# Patient Record
Sex: Female | Born: 1940 | Hispanic: Yes | Marital: Married | Smoking: Never smoker
Health system: Southern US, Community
[De-identification: ages and names within clinical notes are randomized; demographics above are authoritative.]

## PROBLEM LIST (undated history)

## (undated) DIAGNOSIS — F419 Anxiety disorder, unspecified: Secondary | ICD-10-CM

---

## 2010-12-23 ENCOUNTER — Emergency Department (HOSPITAL_COMMUNITY)
Admission: EM | Admit: 2010-12-23 | Discharge: 2010-12-23 | Disposition: A | Discharge status: Home / Self Care | Payer: Self-pay | Attending: Emergency Medicine | Admitting: Emergency Medicine

## 2010-12-23 ENCOUNTER — Other Ambulatory Visit: Payer: Self-pay

## 2010-12-23 ENCOUNTER — Emergency Department (HOSPITAL_COMMUNITY): Payer: Self-pay

## 2010-12-23 DIAGNOSIS — R071 Chest pain on breathing: Secondary | ICD-10-CM | POA: Insufficient documentation

## 2010-12-23 DIAGNOSIS — R0789 Other chest pain: Secondary | ICD-10-CM

## 2010-12-23 DIAGNOSIS — M546 Pain in thoracic spine: Secondary | ICD-10-CM | POA: Insufficient documentation

## 2010-12-23 HISTORY — DX: Anxiety disorder, unspecified: F41.9

## 2010-12-23 LAB — DIFFERENTIAL
Basophils Absolute: 0 10*3/uL (ref 0.0–0.1)
Eosinophils Absolute: 0.1 10*3/uL (ref 0.0–0.7)
Eosinophils Relative: 1 % (ref 0–5)
Neutrophils Relative %: 63 % (ref 43–77)

## 2010-12-23 LAB — D-DIMER, QUANTITATIVE: D-Dimer, Quant: 0.41 ug/mL-FEU (ref 0.00–0.48)

## 2010-12-23 LAB — CBC
MCH: 31.3 pg (ref 26.0–34.0)
MCV: 94.1 fL (ref 78.0–100.0)
Platelets: 183 10*3/uL (ref 150–400)
RDW: 12.9 % (ref 11.5–15.5)
WBC: 7.1 10*3/uL (ref 4.0–10.5)

## 2010-12-23 LAB — BASIC METABOLIC PANEL
Calcium: 8.4 mg/dL (ref 8.4–10.5)
GFR calc Af Amer: >90 mL/min (ref >90)
GFR calc non Af Amer: 89 mL/min — ABNORMAL LOW (ref >90)
Potassium: 3.6 mEq/L (ref 3.5–5.1)
Sodium: 142 mEq/L (ref 135–145)

## 2010-12-23 LAB — POCT I-STAT TROPONIN I: Troponin i, poc: 0 ng/mL (ref 0.00–0.08)

## 2010-12-23 MED ORDER — IBUPROFEN 800 MG PO TABS
400.0000 mg | ORAL_TABLET | Freq: Three times a day (TID) | ORAL | Status: AC | PRN
Start: 2010-12-23 — End: 2011-01-02

## 2010-12-23 NOTE — ED Notes (Signed)
Pt denies any pain at this time.

## 2010-12-23 NOTE — ED Notes (Signed)
Pt back from X-ray.  

## 2010-12-23 NOTE — ED Notes (Signed)
Dr Fredricka Bonine and family notified of 2nd neg troponing

## 2010-12-23 NOTE — ED Provider Notes (Signed)
6:59 PM The patient is 70 year old female who has a previous history of chest discomfort at is sharp radiating to the left shoulder and had this pain again today but reports that she no different than her prior pain. She is from [place] her family tells [place] that she has had this pain extensively evaluated by cardiologist in [place] and it has never been found to be due to anything serious or cardiac related. At this time I have been signed out the patient from Dr. Anitra Lauth for followup on a repeat troponin if negative would allow the patient to be discharged home with confidence that this is not a cardiac event. The repeat troponin has returned and is negative and not suggestive of any myocardial ischemia or infarction. The patient is in no distress and no current pain. I explained the findings to the patient and the family who state their understanding of and agreement with the plan of discharge home.  Felisa Bonier, MD 12/23/10 [phone]

## 2010-12-23 NOTE — ED Notes (Signed)
Patient denies pain and is resting comfortably.  

## 2010-12-23 NOTE — ED Notes (Signed)
Pt denies chest pain at this time.

## 2010-12-23 NOTE — ED Provider Notes (Signed)
History     CSN: [id number] Arrival date & time: 12/23/2010  2:06 PM   First MD Initiated Contact with Patient 12/23/10 1414      Chief Complaint  Patient presents with  . Chest Pain    Pt with sharp CP in left rads to the back, 8/10 now 5/10 after NTG,     (Consider location/radiation/quality/duration/timing/severity/associated sxs/prior treatment) HPI Comments: Patient recently arrived from [place] here to visit her daughter. They were at the mall walking around about an hour after she had eaten and she developed sharp left-sided chest pain that radiated to her upper back without associated symptoms. Pain was initially 6/10 and they called the EMS. EMS gave her aspirin and nitroglycerin which improved her pain to a 3/10. She states she gets this pain all the time and has had multiple evaluations in [place] for the same. Her last evaluation was one month ago and she was told that everything was normal. She denies any leg swelling or pain and denies any infectious symptoms.  Patient is a 70 y.o. female presenting with chest pain. The history is provided by the patient and a relative. The history is limited by a language barrier. A language interpreter was used.  Chest Pain The chest pain began less than 1 hour ago. Duration of episode(s) is 15 minutes. Chest pain occurs constantly. The chest pain is improving. Associated with: Nothing. At its most intense, the pain is at 6/10. The pain is currently at 3/10. The severity of the pain is moderate. The quality of the pain is described as sharp and stabbing. The pain radiates to the upper back. Exacerbated by: Nothing. Pertinent negatives for primary symptoms include no fever, no syncope, no shortness of breath, no cough, no abdominal pain, no nausea, no vomiting and no dizziness. Primary symptoms comment: Anxiety  Pertinent negatives for associated symptoms include no diaphoresis, no lower extremity edema and no weakness. She tried nitroglycerin and  aspirin for the symptoms. Risk factors include no known risk factors.  Pertinent negatives for past medical history include no diabetes, no hyperlipidemia and no hypertension.  Pertinent negatives for family medical history include: no CAD in family and no heart disease in family.  Procedure history is positive for echocardiogram, stress echo and exercise treadmill test.     Past Medical History  Diagnosis Date  . Anxiety     No past surgical history on file.  No family history on file.  History  Substance Use Topics  . Smoking status: Never Smoker   . Smokeless tobacco: Not on file  . Alcohol Use: No    OB History    Grav Para Term Preterm Abortions TAB SAB Ect Mult Living                  Review of Systems  Constitutional: Negative for fever and diaphoresis.  Respiratory: Negative for cough and shortness of breath.   Cardiovascular: Positive for chest pain. Negative for syncope.  Gastrointestinal: Negative for nausea, vomiting and abdominal pain.  Neurological: Negative for dizziness and weakness.  All other systems reviewed and are negative.    Allergies  Penicillins  Home Medications  No current outpatient prescriptions on file.  BP 129/60  Pulse 65  Temp(Src) 98.2 F (36.8 C) (Oral)  Resp 14  SpO2 94%  Physical Exam  Nursing note and vitals reviewed. Constitutional: She is oriented to person, place, and time. She appears well-developed and well-nourished. No distress.  HENT:  Head: Normocephalic and atraumatic.  Eyes: EOM are normal. Pupils are equal, round, and reactive to light.  Cardiovascular: Normal rate, regular rhythm, normal heart sounds and intact distal pulses.  Exam reveals no friction rub.   No murmur heard. Pulmonary/Chest: Effort normal and breath sounds normal. She has no wheezes. She has no rales.  Abdominal: Soft. Bowel sounds are normal. She exhibits no distension. There is no tenderness. There is no rebound and no guarding.    Musculoskeletal: Normal range of motion. She exhibits no tenderness.       No edema  Neurological: She is alert and oriented to person, place, and time. No cranial nerve deficit.  Skin: Skin is warm and dry. No rash noted.  Psychiatric: She has a normal mood and affect. Her behavior is normal.    ED Course  Procedures (including critical care time)  Results for orders placed during the hospital encounter of 12/23/10  D-DIMER, QUANTITATIVE      Component Value Range   D-Dimer, Quant 0.41  0.00 - 0.48 (ug/mL-FEU)  POCT I-STAT TROPONIN I      Component Value Range   Troponin i, poc 0.00  0.00 - 0.08 (ng/mL)   Comment 3            Dg Chest 2 View  12/23/2010  *RADIOLOGY REPORT*  Clinical Data: Chest pain.  CHEST - 2 VIEW  Comparison: None.  Findings: The heart size is normal.  Mild bibasilar airspace disease is present, worse on the left.  No other consolidation is evident.  The visualized soft tissues and bony thorax are unremarkable.  The  IMPRESSION:  1.  Mild bibasilar airspace disease, worse on the left.  While this may represent atelectasis, early infection is not excluded. 2.  No other significant airspace disease.  Original Report Authenticated By: Jamesetta Orleans. MATTERN, M.D.      Date: 12/23/2010  Rate: 65  Rhythm: normal sinus rhythm  QRS Axis: normal  Intervals: normal  ST/T Wave abnormalities: normal  Conduction Disutrbances:none  Narrative Interpretation:   Old EKG Reviewed: none available    No diagnosis found.    MDM   Patient presenting complaining of chest pain that started while she was at the mall. It is a sharp pain that started suddenly while she had been walking around. She denied any shortness of breath, diaphoresis, nausea, vomiting. She states the pain is very sharp and radiates into her left shoulder. Patient has had this pain for many years. She lives in [place] and recently flew in to visit family. She's been evaluated multiple times in [place] with  echocardiograms, stress tests, lab work and nothing has ever been found. Typically she says she takes an aspirin when she gets the pain and a nitroglycerin and it goes away. She has no history of hypertension, hyperlipidemia, and diabetes, or family history of heart disease. She has never smoked and she is well appearing here on exam. After aspirin and nitroglycerin by EMS she is feeling much better and the pain has almost completely resolved. EKG is within normal limits. Given history and prior testing lower concern for cardiac source of pain however will evaluate with CBC to rule out anemia, BMP for electrolyte disturbances, d-dimer as she is a low risk well's candidates, and chest x-ray.   3:44 PM D-dimer, troponin are within normal limits. Chest x-ray within normal limits. CBC and BMP are pending. Will send a 3 hour troponin And if normal will discharge home.   Gwyneth Sprout, MD 12/23/10 [phone]

## 2013-05-06 IMAGING — CR DG CHEST 2V
2 series · 2 of 2 positions shown · non-contrast
Comparison: None.

CLINICAL DATA: Chest pain.

CHEST - 2 VIEW

[w chest pa]
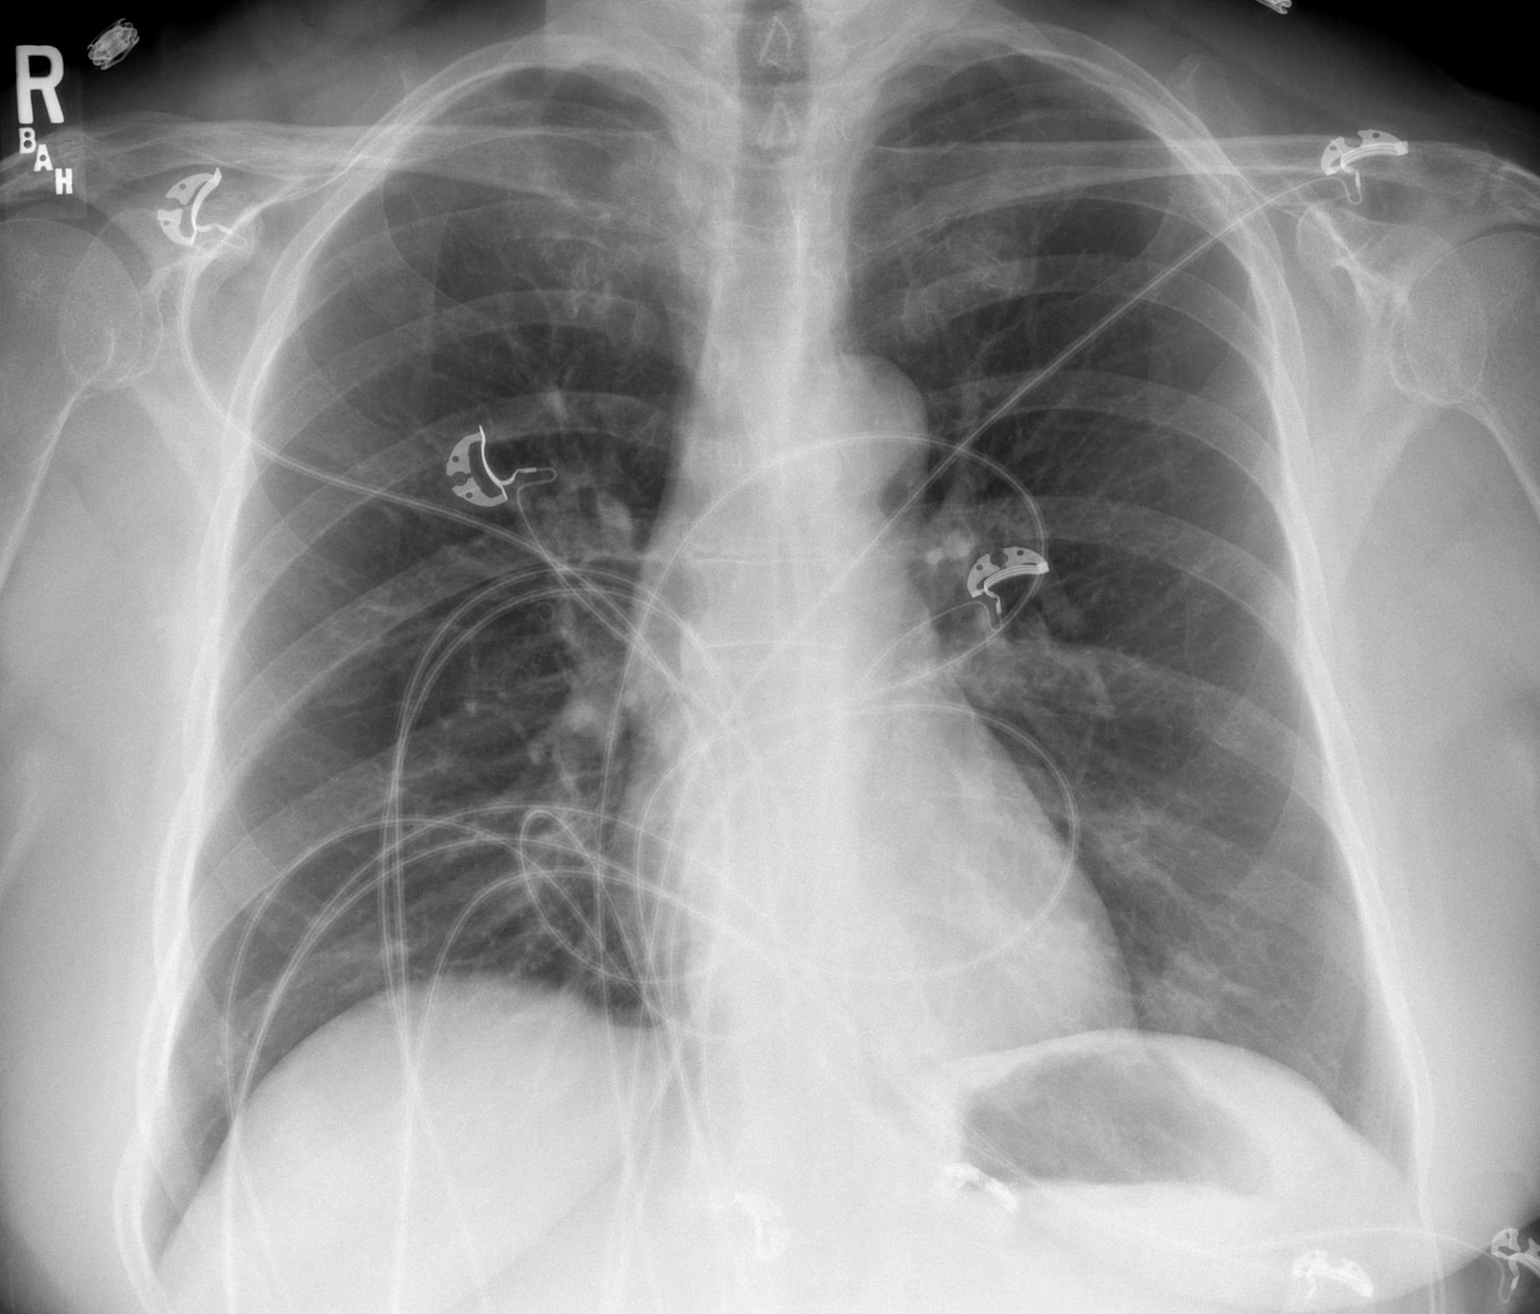

[w chest lat]
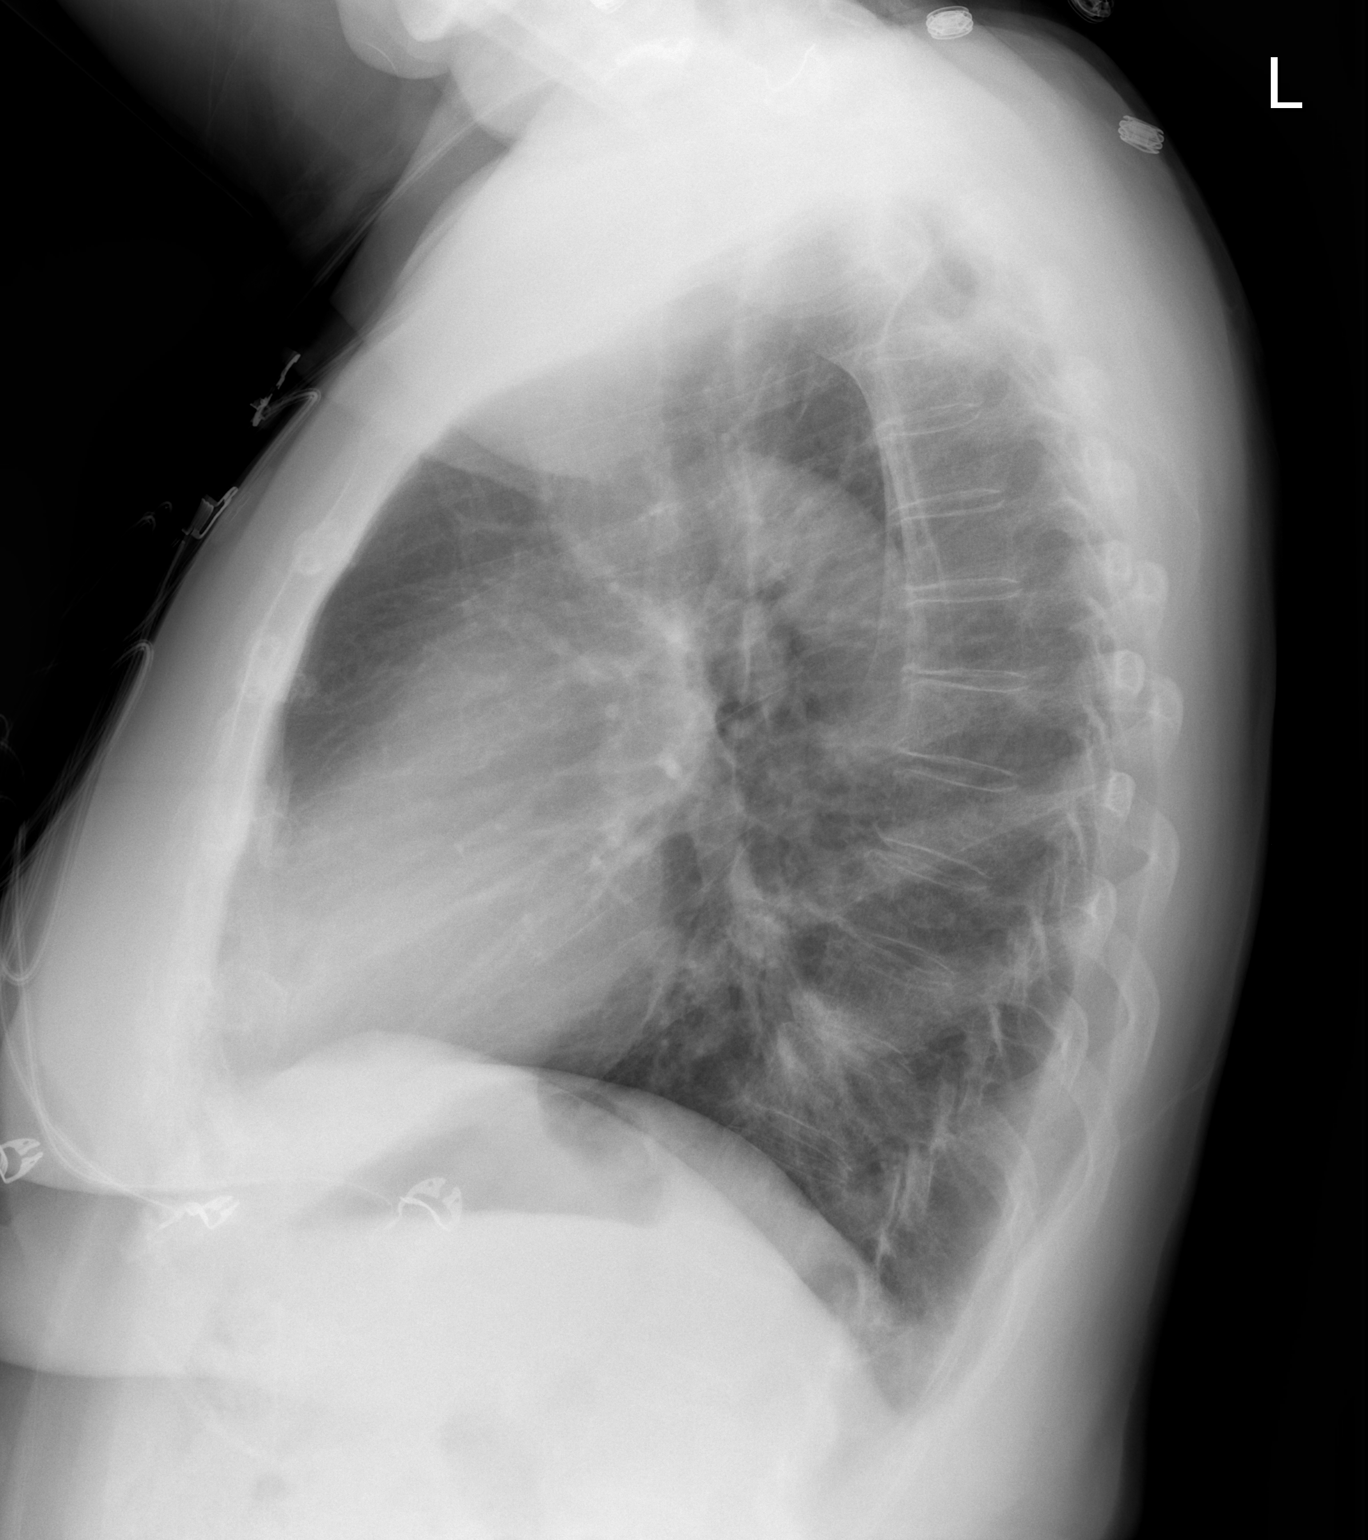

[2 of 2 positions shown; findings below may reference images not displayed]

FINDINGS: The heart size is normal.  Mild bibasilar airspace
disease is present, worse on the left.  No other consolidation is
evident.  The visualized soft tissues and bony thorax are
unremarkable.  The
IMPRESSION: 1.  Mild bibasilar airspace disease, worse on the left.  While this
may represent atelectasis, early infection is not excluded.
2.  No other significant airspace disease.

## 2019-04-25 ENCOUNTER — Encounter: Payer: Self-pay | Admitting: Neurology

## 2019-07-31 ENCOUNTER — Ambulatory Visit: Payer: Self-pay | Admitting: Neurology

## 2019-10-22 ENCOUNTER — Ambulatory Visit: Payer: Self-pay | Admitting: Internal Medicine

## 2020-02-20 ENCOUNTER — Ambulatory Visit (INDEPENDENT_AMBULATORY_CARE_PROVIDER_SITE_OTHER): Payer: Self-pay | Admitting: Family Medicine

## 2020-02-20 ENCOUNTER — Other Ambulatory Visit: Payer: Self-pay

## 2020-02-20 VITALS — BP 124/60 | HR 68 | Ht 60.0 in | Wt 154.2 lb

## 2020-02-20 DIAGNOSIS — F0390 Unspecified dementia without behavioral disturbance: Secondary | ICD-10-CM

## 2020-02-20 LAB — POCT URINALYSIS DIP (MANUAL ENTRY)
Bilirubin, UA: NEGATIVE
Blood, UA: NEGATIVE
Glucose, UA: NEGATIVE mg/dL
Leukocytes, UA: NEGATIVE
Nitrite, UA: NEGATIVE
Protein Ur, POC: NEGATIVE mg/dL
Spec Grav, UA: >=1.03 — AB (ref 1.010–1.025)
Urobilinogen, UA: 0.2 E.U./dL
pH, UA: 5.5 (ref 5.0–8.0)

## 2020-02-20 NOTE — Progress Notes (Signed)
    SUBJECTIVE:   CHIEF COMPLAINT / HPI:   Dorothy Holland is a 80 year old who is here to establish care. She is accompanied by her son in law. A Spanish interpreter was present for visit, but son interpreted the majority of the visit.  Son reports that her insurance recently changed which is why she switched from her previous provider. Will try to obtain records.   Son reports mom was diagnosed with dementia about 7 or 8 years ago. States she had significant personality changes and had a difficult time recognizing family members. States she has been seeing a specialist in [facility] and was put on medication several months ago and has greatly improved, now without behavioral changes, and is able to recognize her family. Denies hypertension, diabetes or any other medical condition. Does endorse patient falling and hurting her shoulder last year around July, and imaging was negative for fracture. Also endorses that patient has a foul odor to her urine. States that she does not drink enough water, and they have been working to increase that. Denies any other concerns.   Denies tobacco, alcohol, or recreational drug use.   PERTINENT  PMH / PSH:  Dementia           OBJECTIVE:   BP 124/60   Pulse 68   Ht 5' (1.524 m)   Wt 69.9 kg   SpO2 97%   BMI 30.12 kg/m    Physical exam General: well appearing, NAD Cardiovascular: RRR, no murmurs Lungs: CTAB. Normal WOB Abdomen: soft, non-distended, non-tender Skin: warm, dry. No edema  ASSESSMENT/PLAN:   No problem-specific Assessment & Plan notes found for this encounter.   Concern for urinary infection Malodorous urine according to son. Denies pain, urinary frequency or hesitancy but difficult to accurately assess given patient's dementia.  - UA negative for infection. Trace ketones and > 1.030 SG likely due to dehydration. - recommended increasing water intake   Dementia Patient taking Donepezil BID, Paroxetine 20mg  qd,  Memantine  10mg  BID - con't meds   Pre-diabetes Hgb A1c 6.3. No medications recommended  - con't healthy and walking  - con't Atorvastatin   Health maintenance  Patient has received COVID vaccines x3 ) as well as flu vaccine.  - CBC, CMP, lipid panel, TSH all wnl   F/u in the next 3 months since new patient, and will hopefully have records by then   , DO [facility]

## 2020-02-20 NOTE — Patient Instructions (Addendum)
It was great seeing you today! Today we established care at the clinic, and are checking your blood work  Please check-out at the front desk before leaving the clinic. I'd like to see you back in 1-3 months but if you need to be seen earlier than that for any new issues we're happy to fit you in, just give [place] a call!  Regarding lab work today:  Due to recent changes in healthcare laws, you may see the results of your imaging and laboratory studies on MyChart before your provider has had a chance to review them.  I understand that in some cases there may be results that are confusing or concerning to you. Not all laboratory results come back in the same time frame and you may be waiting for multiple results in order to interpret others.  Please give [place] 72 hours in order for your provider to thoroughly review all the results before contacting the office for clarification of your results. If everything is normal, you will get a letter in the mail or a message in My Chart. Please give [place] a call if you do not hear from [place] after 2 weeks.  Please bring all of your medications with you to each visit.    If you haven't already, sign up for My Chart to have easy access to your labs results, and communication with your primary care physician.  Feel free to call with any questions or concerns at any time, at [phone].   Take care,  Dr. Cora Collum [facility]

## 2020-02-21 LAB — LIPID PANEL
Chol/HDL Ratio: 2.1 ratio (ref 0.0–4.4)
Cholesterol, Total: 118 mg/dL (ref 100–199)
HDL: 57 mg/dL (ref >39)
LDL Chol Calc (NIH): 38 mg/dL (ref 0–99)
Triglycerides: 131 mg/dL (ref 0–149)
VLDL Cholesterol Cal: 23 mg/dL (ref 5–40)

## 2020-02-21 LAB — COMPREHENSIVE METABOLIC PANEL
ALT: 16 IU/L (ref 0–32)
AST: 22 IU/L (ref 0–40)
Albumin/Globulin Ratio: 1.6 (ref 1.2–2.2)
Albumin: 3.8 g/dL (ref 3.7–4.7)
Alkaline Phosphatase: 86 IU/L (ref 44–121)
BUN/Creatinine Ratio: 25 (ref 12–28)
BUN: 16 mg/dL (ref 8–27)
Bilirubin Total: 0.3 mg/dL (ref 0.0–1.2)
CO2: 26 mmol/L (ref 20–29)
Calcium: 8.9 mg/dL (ref 8.7–10.3)
Chloride: 103 mmol/L (ref 96–106)
Creatinine, Ser: 0.63 mg/dL (ref 0.57–1.00)
GFR calc Af Amer: 99 mL/min/{1.73_m2} (ref >59)
GFR calc non Af Amer: 86 mL/min/{1.73_m2} (ref >59)
Globulin, Total: 2.4 g/dL (ref 1.5–4.5)
Glucose: 98 mg/dL (ref 65–99)
Potassium: 3.9 mmol/L (ref 3.5–5.2)
Sodium: 142 mmol/L (ref 134–144)
Total Protein: 6.2 g/dL (ref 6.0–8.5)

## 2020-02-21 LAB — CBC
Hematocrit: 40 % (ref 34.0–46.6)
Hemoglobin: 13.2 g/dL (ref 11.1–15.9)
MCH: 30.8 pg (ref 26.6–33.0)
MCHC: 33 g/dL (ref 31.5–35.7)
MCV: 94 fL (ref 79–97)
Platelets: 216 10*3/uL (ref 150–450)
RBC: 4.28 x10E6/uL (ref 3.77–5.28)
RDW: 12.7 % (ref 11.7–15.4)
WBC: 6 10*3/uL (ref 3.4–10.8)

## 2020-02-21 LAB — HCV INTERPRETATION

## 2020-02-21 LAB — TSH: TSH: 2.36 u[IU]/mL (ref 0.450–4.500)

## 2020-02-21 LAB — HCV AB W REFLEX TO QUANT PCR: HCV Ab: <0.1 s/co ratio (ref 0.0–0.9)

## 2020-02-21 LAB — HEMOGLOBIN A1C
Est. average glucose Bld gHb Est-mCnc: 134 mg/dL
Hgb A1c MFr Bld: 6.3 % — ABNORMAL HIGH (ref 4.8–5.6)

## 2020-11-10 ENCOUNTER — Observation Stay (HOSPITAL_COMMUNITY)
Admission: EM | Admit: 2020-11-10 | Discharge: 2020-11-11 | Disposition: A | Discharge status: Home / Self Care | Payer: Medicare Other | Attending: Family Medicine | Admitting: Family Medicine

## 2020-11-10 ENCOUNTER — Emergency Department (HOSPITAL_COMMUNITY): Payer: Medicare Other

## 2020-11-10 DIAGNOSIS — F0390 Unspecified dementia without behavioral disturbance: Secondary | ICD-10-CM | POA: Insufficient documentation

## 2020-11-10 DIAGNOSIS — R001 Bradycardia, unspecified: Secondary | ICD-10-CM | POA: Diagnosis not present

## 2020-11-10 DIAGNOSIS — G309 Alzheimer's disease, unspecified: Secondary | ICD-10-CM | POA: Diagnosis not present

## 2020-11-10 DIAGNOSIS — R4182 Altered mental status, unspecified: Secondary | ICD-10-CM | POA: Diagnosis not present

## 2020-11-10 DIAGNOSIS — R7303 Prediabetes: Secondary | ICD-10-CM | POA: Insufficient documentation

## 2020-11-10 DIAGNOSIS — T426X4A Poisoning by other antiepileptic and sedative-hypnotic drugs, undetermined, initial encounter: Secondary | ICD-10-CM | POA: Diagnosis not present

## 2020-11-10 DIAGNOSIS — G3189 Other specified degenerative diseases of nervous system: Secondary | ICD-10-CM | POA: Insufficient documentation

## 2020-11-10 DIAGNOSIS — Z20822 Contact with and (suspected) exposure to covid-19: Secondary | ICD-10-CM | POA: Insufficient documentation

## 2020-11-10 DIAGNOSIS — R0600 Dyspnea, unspecified: Secondary | ICD-10-CM | POA: Insufficient documentation

## 2020-11-10 DIAGNOSIS — R9431 Abnormal electrocardiogram [ECG] [EKG]: Secondary | ICD-10-CM | POA: Diagnosis not present

## 2020-11-10 DIAGNOSIS — D649 Anemia, unspecified: Secondary | ICD-10-CM | POA: Insufficient documentation

## 2020-11-10 DIAGNOSIS — T43211A Poisoning by selective serotonin and norepinephrine reuptake inhibitors, accidental (unintentional), initial encounter: Secondary | ICD-10-CM | POA: Diagnosis not present

## 2020-11-10 DIAGNOSIS — Y900 Blood alcohol level of less than 20 mg/100 ml: Secondary | ICD-10-CM | POA: Insufficient documentation

## 2020-11-10 DIAGNOSIS — T50901A Poisoning by unspecified drugs, medicaments and biological substances, accidental (unintentional), initial encounter: Secondary | ICD-10-CM

## 2020-11-10 DIAGNOSIS — R2681 Unsteadiness on feet: Secondary | ICD-10-CM | POA: Diagnosis not present

## 2020-11-10 DIAGNOSIS — Z79899 Other long term (current) drug therapy: Secondary | ICD-10-CM | POA: Diagnosis not present

## 2020-11-10 DIAGNOSIS — M6281 Muscle weakness (generalized): Secondary | ICD-10-CM | POA: Insufficient documentation

## 2020-11-10 DIAGNOSIS — F02C3 Dementia in other diseases classified elsewhere, severe, with mood disturbance: Secondary | ICD-10-CM

## 2020-11-10 LAB — I-STAT CHEM 8, ED
BUN: 33 mg/dL — ABNORMAL HIGH (ref 8–23)
Calcium, Ion: 1.15 mmol/L (ref 1.15–1.40)
Chloride: 101 mmol/L (ref 98–111)
Creatinine, Ser: 0.7 mg/dL (ref 0.44–1.00)
Glucose, Bld: 138 mg/dL — ABNORMAL HIGH (ref 70–99)
HCT: 44 % (ref 36.0–46.0)
Hemoglobin: 15 g/dL (ref 12.0–15.0)
Potassium: 5.2 mmol/L — ABNORMAL HIGH (ref 3.5–5.1)
Sodium: 138 mmol/L (ref 135–145)
TCO2: 31 mmol/L (ref 22–32)

## 2020-11-10 LAB — CBC WITH DIFFERENTIAL/PLATELET
Abs Immature Granulocytes: 0.04 10*3/uL (ref 0.00–0.07)
Basophils Absolute: 0 10*3/uL (ref 0.0–0.1)
Basophils Relative: 0 %
Eosinophils Absolute: 0.1 10*3/uL (ref 0.0–0.5)
Eosinophils Relative: 1 %
HCT: 44.1 % (ref 36.0–46.0)
Hemoglobin: 14.3 g/dL (ref 12.0–15.0)
Immature Granulocytes: 1 %
Lymphocytes Relative: 14 %
Lymphs Abs: 1.2 10*3/uL (ref 0.7–4.0)
MCH: 31.9 pg (ref 26.0–34.0)
MCHC: 32.4 g/dL (ref 30.0–36.0)
MCV: 98.4 fL (ref 80.0–100.0)
Monocytes Absolute: 0.4 10*3/uL (ref 0.1–1.0)
Monocytes Relative: 4 %
Neutro Abs: 6.7 10*3/uL (ref 1.7–7.7)
Neutrophils Relative %: 80 %
Platelets: 197 10*3/uL (ref 150–400)
RBC: 4.48 MIL/uL (ref 3.87–5.11)
RDW: 13.8 % (ref 11.5–15.5)
WBC: 8.4 10*3/uL (ref 4.0–10.5)
nRBC: 0 % (ref 0.0–0.2)

## 2020-11-10 LAB — ACETAMINOPHEN LEVEL: Acetaminophen (Tylenol), Serum: <10 ug/mL — ABNORMAL LOW (ref 10–30)

## 2020-11-10 LAB — LIPASE, BLOOD: Lipase: 30 U/L (ref 11–51)

## 2020-11-10 LAB — I-STAT ARTERIAL BLOOD GAS, ED
Acid-Base Excess: 4 mmol/L — ABNORMAL HIGH (ref 0.0–2.0)
Bicarbonate: 30.9 mmol/L — ABNORMAL HIGH (ref 20.0–28.0)
Calcium, Ion: 1.24 mmol/L (ref 1.15–1.40)
HCT: 41 % (ref 36.0–46.0)
Hemoglobin: 13.9 g/dL (ref 12.0–15.0)
O2 Saturation: 96 %
Patient temperature: 97.2
Potassium: 4.1 mmol/L (ref 3.5–5.1)
Sodium: 137 mmol/L (ref 135–145)
TCO2: 33 mmol/L — ABNORMAL HIGH (ref 22–32)
pCO2 arterial: 52.4 mmHg — ABNORMAL HIGH (ref 32.0–48.0)
pH, Arterial: 7.375 (ref 7.350–7.450)
pO2, Arterial: 81 mmHg — ABNORMAL LOW (ref 83.0–108.0)

## 2020-11-10 LAB — RESP PANEL BY RT-PCR (FLU A&B, COVID) ARPGX2
Influenza A by PCR: NEGATIVE
Influenza B by PCR: NEGATIVE
SARS Coronavirus 2 by RT PCR: NEGATIVE

## 2020-11-10 LAB — SALICYLATE LEVEL: Salicylate Lvl: <7 mg/dL — ABNORMAL LOW (ref 7.0–30.0)

## 2020-11-10 LAB — PROTIME-INR
INR: 1.1 (ref 0.8–1.2)
Prothrombin Time: 14.1 seconds (ref 11.4–15.2)

## 2020-11-10 LAB — COMPREHENSIVE METABOLIC PANEL
ALT: 14 U/L (ref 0–44)
AST: 29 U/L (ref 15–41)
Albumin: 3.4 g/dL — ABNORMAL LOW (ref 3.5–5.0)
Alkaline Phosphatase: 68 U/L (ref 38–126)
Anion gap: 4 — ABNORMAL LOW (ref 5–15)
BUN: 21 mg/dL (ref 8–23)
CO2: 27 mmol/L (ref 22–32)
Calcium: 8.6 mg/dL — ABNORMAL LOW (ref 8.9–10.3)
Chloride: 102 mmol/L (ref 98–111)
Creatinine, Ser: 0.7 mg/dL (ref 0.44–1.00)
GFR, Estimated: >60 mL/min (ref >60)
Glucose, Bld: 135 mg/dL — ABNORMAL HIGH (ref 70–99)
Potassium: 4.8 mmol/L (ref 3.5–5.1)
Sodium: 133 mmol/L — ABNORMAL LOW (ref 135–145)
Total Bilirubin: 1 mg/dL (ref 0.3–1.2)
Total Protein: 6.6 g/dL (ref 6.5–8.1)

## 2020-11-10 LAB — TROPONIN I (HIGH SENSITIVITY)
Troponin I (High Sensitivity): 6 ng/L (ref <18)
Troponin I (High Sensitivity): 5 ng/L (ref <18)

## 2020-11-10 LAB — TYPE AND SCREEN
ABO/RH(D): O POS
Antibody Screen: NEGATIVE

## 2020-11-10 LAB — BRAIN NATRIURETIC PEPTIDE: B Natriuretic Peptide: 49.1 pg/mL (ref 0.0–100.0)

## 2020-11-10 LAB — ABO/RH: ABO/RH(D): O POS

## 2020-11-10 LAB — CBG MONITORING, ED: Glucose-Capillary: 144 mg/dL — ABNORMAL HIGH (ref 70–99)

## 2020-11-10 LAB — ETHANOL: Alcohol, Ethyl (B): <10 mg/dL (ref <10)

## 2020-11-10 LAB — LACTIC ACID, PLASMA: Lactic Acid, Venous: 1.5 mmol/L (ref 0.5–1.9)

## 2020-11-10 MED ORDER — ATORVASTATIN CALCIUM 10 MG PO TABS
20.0000 mg | ORAL_TABLET | Freq: Every day | ORAL | Status: DC
  Administered 2020-11-11: 20 mg via ORAL
  Filled 2020-11-10: qty 2

## 2020-11-10 MED ORDER — SODIUM CHLORIDE 0.9 % IV BOLUS
500.0000 mL | Freq: Once | INTRAVENOUS | Status: AC
  Administered 2020-11-10: 500 mL via INTRAVENOUS

## 2020-11-10 MED ORDER — SODIUM CHLORIDE 0.9 % IV SOLN: INTRAVENOUS | Status: DC

## 2020-11-10 MED ORDER — DONEPEZIL HCL 10 MG PO TABS
10.0000 mg | ORAL_TABLET | Freq: Two times a day (BID) | ORAL | Status: DC
  Administered 2020-11-11: 10 mg via ORAL
  Filled 2020-11-10 (×2): qty 1

## 2020-11-10 MED ORDER — ENOXAPARIN SODIUM 40 MG/0.4ML IJ SOSY
40.0000 mg | PREFILLED_SYRINGE | INTRAMUSCULAR | Status: DC
  Administered 2020-11-11: 40 mg via SUBCUTANEOUS
  Filled 2020-11-10: qty 0.4

## 2020-11-10 MED ORDER — CITALOPRAM HYDROBROMIDE 10 MG PO TABS
20.0000 mg | ORAL_TABLET | Freq: Every day | ORAL | Status: DC
  Administered 2020-11-11: 20 mg via ORAL
  Filled 2020-11-10: qty 2

## 2020-11-10 MED ORDER — MEMANTINE HCL 10 MG PO TABS
10.0000 mg | ORAL_TABLET | Freq: Every day | ORAL | Status: DC
  Administered 2020-11-11: 10 mg via ORAL
  Filled 2020-11-10: qty 1

## 2020-11-10 MED ORDER — SODIUM CHLORIDE 0.9 % IV SOLN: Freq: Once | INTRAVENOUS | Status: DC

## 2020-11-10 MED ORDER — PAROXETINE HCL 20 MG PO TABS
20.0000 mg | ORAL_TABLET | Freq: Every day | ORAL | Status: DC
  Administered 2020-11-11: 20 mg via ORAL
  Filled 2020-11-10: qty 1

## 2020-11-10 MED ORDER — NALOXONE HCL 0.4 MG/ML IJ SOLN
0.4000 mg | Freq: Once | INTRAMUSCULAR | Status: AC
  Administered 2020-11-10: 0.4 mg via INTRAVENOUS
  Filled 2020-11-10: qty 1

## 2020-11-10 NOTE — H&P (Signed)
Family Medicine Teaching [facility] Admission History and Physical Service Pager: [phone]  Patient name: Dorothy Holland Medical record number: [id number] Date of birth: 09/10/1940 Age: 80 y.o. Gender: female  Primary Care Provider: Cora Collum, DO Consultants: None Code Status: Full Preferred Emergency Contact: Son in [place] [phone] Daughter Janann August [phone]  Chief Complaint: altered mental status, suspected Lyrica OD  Assessment and Plan: [name] is a 80 y.o. female presenting with somnolence . PMH is significant for dementia and prediabetes.   Altered mental status Patient presented to ED via EMS for somnolence due to suspected accidental Lyrica overdose.  Per family at bedside in the ED, she may have ingested around 2100 mg Lyrica late this morning and become somnolent sometime around 1 PM.  On presentation to the ED, she was arousable to pain with stable vital signs and borderline bradycardia with heart rate in the 50s-60s.  She was protecting her airway, afebrile, and had normal sinus rhythm on EKG.  Chest x-ray and head CT unremarkable.  CBC, CMP and BNP largely within normal limits.  Troponins, lactic acid, salicylate, and ethanol levels negative.  UDS to be collected.  Physical exam remarkable for right pupil nonreactive to light, however stable while in ED.  ED contacted poison control, who recommended EKG for bradycardia and BP monitoring for hypotension.  Top differential diagnosis is accidental Lyrica overdose.  Less likely is brain bleed or stroke given negative CT head.  Infectious process such as meningitis less likely given lack of fever or leukocytosis and acute onset today. - Admit to med telemetry with Dr. Leveda Anna attending - Continuous cardiac monitoring - Neurological checks every 4 hours - Vitals per unit routine - Normal saline maintenance fluids while n.p.o. - PT/OT eval and treat - Repeat head imaging  if becomes hemodynamically unstable -Follow-up results of ED blood cultures -A.m. CBC, CMP -A.m. RPR given abnormal pupillary response and inability to test other EOM including accommodation  Dementia Sees neurologist Dr. Aletha Halim in [facility], who prescribes her dementia meds. Home meds: Paroxetine hydrochloride 20 mg daily, Quetiapine 25 mg BID (may give up to TID if aggressive), Citalopram 20 mg daily, Donepezil 10 mg BID, and Memantine 10 mg daily.  -Continue paroxetine, citalopram, donepezil, memantine (to start tomorrow) - Hold quetiapine for sedating effects  Pre-diabetes -A1c tomorrow morning - Continue home atorvastatin 20 mg daily   FEN/GI: NPO at this time Prophylaxis: Lovenox  Disposition: Med tele  History of Present Illness:  Dorothy Holland is a 80 y.o. female presenting with somnolence of unknown etiology.  She presented to the ED via EMS.  Lives at home with family, they suspect she may have accidentally ingested around 2100 mg of Lyrica sometime in the late morning and becoming sleepy in the early afternoon.  And family could not wake her from her normal afternoon nap, they called EMS.  Son-in-law is currently at bedside to give history.  He says that the patient just returned about a week ago from a trip to [place].  She also sees doctors in [place] and came back with a prescription for Lyrica, but the family has not been administering this drug to her while she is back home in the last week.  Review Of Systems: Per HPI with the following additions:   Review of Systems  Constitutional:  Negative for chills and diaphoresis.  Respiratory:  Negative for cough, choking and shortness of breath.   Cardiovascular:  Negative for chest pain.  Gastrointestinal:  Negative for abdominal pain.  Neurological:  Negative for seizures, syncope and speech difficulty.  Psychiatric/Behavioral:  Negative for agitation, behavioral problems and decreased concentration.      Patient Active Problem List   Diagnosis Date Noted   Accidental overdose of trazodone 11/10/2020   Pre-diabetes 11/10/2020   Dementia (HCC) 11/10/2020    Past Medical History: Past Medical History:  Diagnosis Date   Anxiety     Past Surgical History: None  Social History: Social History   Tobacco Use   Smoking status: Never  Substance Use Topics   Alcohol use: No   Drug use: No   Additional social history: None  Please also refer to relevant sections of EMR.  Family History: None  Allergies and Medications: Allergies  Allergen Reactions   Penicillins Other (See Comments)    Numbness and shakiness   No current facility-administered medications on file prior to encounter.   Current Outpatient Medications on File Prior to Encounter  Medication Sig Dispense Refill   atorvastatin (LIPITOR) 20 MG tablet Take 20 mg by mouth daily.     donepezil (ARICEPT) 5 MG tablet Take 5 mg by mouth at bedtime.     memantine (NAMENDA) 10 MG tablet Take 10 mg by mouth 2 (two) times daily.     PARoxetine (PAXIL) 20 MG tablet Take 20 mg by mouth daily.      Objective: BP 122/73   Pulse 61   Temp (!) 97.2 F (36.2 C) (Rectal)   Resp 16   Ht 5' (1.524 m)   Wt 70 kg   SpO2 95%   BMI 30.14 kg/m  Exam: General: Sleeping, snoring, withdrawals to pain, will not respond to commands Eyes: Right pupil non-reactive to light (however will constrict when left pupil exposed to light), left pupil reactive to light ENTM: poor dentition, partial bridge in per son-in-law at bedside Neck: supple Cardiovascular: RRR, no murmur Respiratory: CTAB Gastrointestinal: soft abdomen, no masses, no tenderness elicited MSK: will not follow commands Derm: no lesions or skin breakdown visualized Neuro: Eye exam as above Psych: Unable to assess due to somnolence  Labs and Imaging: CBC BMET  Recent Labs  Lab 11/10/20 1930  WBC 8.4  HGB 14.3  HCT 44.1  PLT 197   Recent Labs  Lab  11/10/20 1930  NA 133*  K 4.8  CL 102  CO2 27  BUN 21  CREATININE 0.70  GLUCOSE 135*  CALCIUM 8.6*     EKG: Normal sinus rhythm at 63 bpm without ST elevation  PORTABLE CHEST 1 VIEW 11/10/2020  COMPARISON:  12/23/2010 FINDINGS: Cardiac silhouette is normal in size. No mediastinal or hilar masses. Lungs demonstrate interstitial prominence, most evident in the lower lungs. No lung consolidation to suggest pneumonia. No evidence of pulmonary edema. No convincing pleural effusion and no pneumothorax. Skeletal structures are demineralized but grossly intact. IMPRESSION: No acute cardiopulmonary disease.  CT HEAD WITHOUT CONTRAST 11/10/2020  FINDINGS: Brain: Age related atrophy no intracranial hemorrhage, mass effect, or midline shift. No hydrocephalus. The basilar cisterns are patent. Partially empty sella, typically incidental. No evidence of territorial infarct or acute ischemia. No extra-axial or intracranial fluid collection. Vascular: Atherosclerosis of skullbase vasculature without hyperdense vessel or abnormal calcification. Skull: No fracture or focal lesion. Sinuses/Orbits: Paranasal sinuses and mastoid air cells are clear. The visualized orbits are unremarkable. Other: None. IMPRESSION: 1. No acute intracranial abnormality. 2. Age related atrophy.     Fayette Pho, MD 11/10/2020, 9:36 PM PGY-2, [facility]PT[facility]  Intern pager: [phone], text pages welcome

## 2020-11-10 NOTE — Progress Notes (Signed)
Family medicine teaching service will be admitting this patient. Our pager information can be located in the physician sticky notes, treatment team sticky notes, and the headers of all our official daily progress notes.   FAMILY MEDICINE TEACHING SERVICE Patient - Please contact intern pager [phone] or text page via website [email or web address] (login: [email or web address]) for questions regarding care. DO NOT page listed attending provider unless there is no answer from the number above.   Dorothy Golden, MD PGY-2, [facility] pager [phone]   

## 2020-11-10 NOTE — ED Provider Notes (Signed)
[facility] EMERGENCY DEPARTMENT Provider Note   CSN: [id number] Arrival date & time: 11/10/20  1756     History Chief Complaint  Patient presents with   Altered Mental Status   Ingestion    Dorothy Holland is a 80 y.o. female.  80 year old female with prior medical history detailed below presents for evaluation.  Patient is transported by EMS from home.  Patient is shortly followed by her son-in-law who provides majority of history.  Patient with history of dementia.  Patient may have had unwitnessed ingestion of excessive amount of Lyrica at around 10 AM.  The bottle was found with the cap off and multiple pills were missing.  Patient apparently was acting at her normal level of capacity until around noon.  At this point she was noted to be more lethargic and sleeping in the chair.  This is not unusual however, she remained quite somnolent through the evening and thus was sent to the ED for evaluation.  Patient is unable to provide history.  Level 5 caveat secondary to same.  History is obtained from the patient's son-in-law at bedside.  He did not directly witness ingestion.  Again, family members found the opened bottle with missing tablets.  Questionable time of ingestion is around 10 AM.  The history is provided by the patient and the EMS personnel.  Altered Mental Status Presenting symptoms: lethargy   Severity:  Moderate Most recent episode:  Today Episode history:  Single Duration:  8 hours Timing:  Constant Progression:  Worsening Chronicity:  New Ingestion      Past Medical History:  Diagnosis Date   Anxiety     There are no problems to display for this patient.     OB History   No obstetric history on file.     No family history on file.  Social History   Tobacco Use   Smoking status: Never  Substance Use Topics   Alcohol use: No   Drug use: No    Home Medications Prior to Admission medications   Not on File     Allergies    Penicillins  Review of Systems   Review of Systems  Physical Exam Updated Vital Signs BP 126/74 (BP Location: Right Arm)   Pulse 64   Temp (!) 97.2 F (36.2 C) (Rectal)   Resp (!) 7   Ht 5' (1.524 m)   Wt 70 kg   SpO2 100%   BMI 30.14 kg/m   Physical Exam  ED Results / Procedures / Treatments   Labs (all labs ordered are listed, but only abnormal results are displayed) Labs Reviewed  I-STAT CHEM 8, ED - Abnormal; Notable for the following components:      Result Value   Potassium 5.2 (*)    BUN 33 (*)    Glucose, Bld 138 (*)    All other components within normal limits  CBG MONITORING, ED - Abnormal; Notable for the following components:   Glucose-Capillary 144 (*)    All other components within normal limits  I-STAT ARTERIAL BLOOD GAS, ED - Abnormal; Notable for the following components:   pCO2 arterial 52.4 (*)    pO2, Arterial 81 (*)    Bicarbonate 30.9 (*)    TCO2 33 (*)    Acid-Base Excess 4.0 (*)    All other components within normal limits  RESP PANEL BY RT-PCR (FLU A&B, COVID) ARPGX2  CULTURE, BLOOD (ROUTINE X 2)  CULTURE, BLOOD (ROUTINE X 2)  URINALYSIS, ROUTINE  W REFLEX MICROSCOPIC  RAPID URINE DRUG SCREEN, HOSP PERFORMED  SALICYLATE LEVEL  LACTIC ACID, PLASMA  LACTIC ACID, PLASMA  ETHANOL  COMPREHENSIVE METABOLIC PANEL  ACETAMINOPHEN LEVEL  LIPASE, BLOOD  BRAIN NATRIURETIC PEPTIDE  CBC WITH DIFFERENTIAL/PLATELET  PROTIME-INR  BLOOD GAS, ARTERIAL  TYPE AND SCREEN  ABO/RH  TROPONIN I (HIGH SENSITIVITY)  TROPONIN I (HIGH SENSITIVITY)    EKG EKG Interpretation  Date/Time:  Tuesday November 10 2020 18:11:42 EDT Ventricular Rate:  63 PR Interval:  136 QRS Duration: 92 QT Interval:  415 QTC Calculation: 425 R Axis:   67 Text Interpretation: Sinus rhythm ST elevation, consider inferior injury Confirmed by Kristine Royal [phone]) on 11/10/2020 6:14:46 PM  Radiology No results found.  Procedures Procedures    Medications Ordered in ED Medications  naloxone (NARCAN) injection 0.4 mg (0.4 mg Intravenous Given 11/10/20 1844)    ED Course  I have reviewed the triage vital signs and the nursing notes.  Pertinent labs & imaging results that were available during my care of the patient were reviewed by me and considered in my medical decision making (see chart for details).    MDM Rules/Calculators/A&P                          CRITICAL CARE Performed by: Wynetta Fines   Total critical care time: 45 minutes  Critical care time was exclusive of separately billable procedures and treating other patients.  Critical care was necessary to treat or prevent imminent or life-threatening deterioration.  Critical care was time spent personally by me on the following activities: development of treatment plan with patient and/or surrogate as well as nursing, discussions with consultants, evaluation of patient's response to treatment, examination of patient, obtaining history from patient or surrogate, ordering and performing treatments and interventions, ordering and review of laboratory studies, ordering and review of radiographic studies, pulse oximetry and re-evaluation of patient's condition.   MDM  MSE complete  Dorothy Holland was evaluated in Emergency Department on 11/10/2020 for the symptoms described in the history of present illness. She was evaluated in the context of the global COVID-19 pandemic, which necessitated consideration that the patient might be at risk for infection with the SARS-CoV-2 virus that causes COVID-19. Institutional protocols and algorithms that pertain to the evaluation of patients at risk for COVID-19 are in a state of rapid change based on information released by regulatory bodies including the CDC and federal and state organizations. These policies and algorithms were followed during the patient's care in the ED.  Patient is presenting after possible  unwitnessed ingestion of excessive Lyrica.  Poison control contacted.  Recommend close observation and supportive care.  Excessive somnolence/AMS is to be expected.  Hypotension should be observed for.  Patient's work-up is without evidence of other significant pathology.  Patient remains somnolent.  She is arousable with painful stimulation.  She is protecting her airway.  Patient would benefit from admission and continued observation. Family medicine aware of case.      Final Clinical Impression(s) / ED Diagnoses Final diagnoses:  None    Rx / DC Orders ED Discharge Orders     None        Wynetta Fines, MD 11/10/20 2125

## 2020-11-10 NOTE — ED Triage Notes (Signed)
Pt from home - has baseline hx dementia - family discovered pt ingested large amt of 75mg  lyrica - EMS estimates 2100 based on pills missing. Family reports this happened around 10am, but no symptoms or problems until recently when pt began vomiting profusely so family called EMS. Unsure pt's LKW, family en route. Pt with snoring respirations, responds only to pain at this time.

## 2020-11-10 NOTE — ED Notes (Signed)
Pt is hard to arouse. She only responds to sternal rub stimuli. RN will notify MD on update.

## 2020-11-10 NOTE — ED Notes (Signed)
Call lab to add Magnesium blood draw.

## 2020-11-10 NOTE — ED Notes (Signed)
Return from CT, RT at bedside obtaining ABG. Pt remains somnolent, family member at bedside.

## 2020-11-11 DIAGNOSIS — T426X4A Poisoning by other antiepileptic and sedative-hypnotic drugs, undetermined, initial encounter: Secondary | ICD-10-CM | POA: Diagnosis not present

## 2020-11-11 DIAGNOSIS — R4182 Altered mental status, unspecified: Secondary | ICD-10-CM | POA: Diagnosis not present

## 2020-11-11 DIAGNOSIS — T50901A Poisoning by unspecified drugs, medicaments and biological substances, accidental (unintentional), initial encounter: Secondary | ICD-10-CM | POA: Diagnosis not present

## 2020-11-11 DIAGNOSIS — T43211A Poisoning by selective serotonin and norepinephrine reuptake inhibitors, accidental (unintentional), initial encounter: Secondary | ICD-10-CM | POA: Diagnosis not present

## 2020-11-11 DIAGNOSIS — G309 Alzheimer's disease, unspecified: Secondary | ICD-10-CM | POA: Diagnosis not present

## 2020-11-11 LAB — CBC
HCT: 28.3 % — ABNORMAL LOW (ref 36.0–46.0)
HCT: 41.7 % (ref 36.0–46.0)
Hemoglobin: 8.7 g/dL — ABNORMAL LOW (ref 12.0–15.0)
Hemoglobin: 13.3 g/dL (ref 12.0–15.0)
MCH: 31.9 pg (ref 26.0–34.0)
MCH: 31.7 pg (ref 26.0–34.0)
MCHC: 30.7 g/dL (ref 30.0–36.0)
MCHC: 31.9 g/dL (ref 30.0–36.0)
MCV: 103.7 fL — ABNORMAL HIGH (ref 80.0–100.0)
MCV: 99.5 fL (ref 80.0–100.0)
Platelets: 112 10*3/uL — ABNORMAL LOW (ref 150–400)
Platelets: 177 10*3/uL (ref 150–400)
RBC: 2.73 MIL/uL — ABNORMAL LOW (ref 3.87–5.11)
RBC: 4.19 MIL/uL (ref 3.87–5.11)
RDW: 13.8 % (ref 11.5–15.5)
RDW: 13.7 % (ref 11.5–15.5)
WBC: 5 10*3/uL (ref 4.0–10.5)
WBC: 7.1 10*3/uL (ref 4.0–10.5)
nRBC: 0 % (ref 0.0–0.2)
nRBC: 0 % (ref 0.0–0.2)

## 2020-11-11 LAB — RPR: RPR Ser Ql: NONREACTIVE

## 2020-11-11 LAB — COMPREHENSIVE METABOLIC PANEL
ALT: 11 U/L (ref 0–44)
ALT: 16 U/L (ref 0–44)
AST: 16 U/L (ref 15–41)
AST: 23 U/L (ref 15–41)
Albumin: 1.9 g/dL — ABNORMAL LOW (ref 3.5–5.0)
Albumin: 2.9 g/dL — ABNORMAL LOW (ref 3.5–5.0)
Alkaline Phosphatase: 38 U/L (ref 38–126)
Alkaline Phosphatase: 51 U/L (ref 38–126)
Anion gap: 4 — ABNORMAL LOW (ref 5–15)
Anion gap: 7 (ref 5–15)
BUN: 14 mg/dL (ref 8–23)
BUN: 16 mg/dL (ref 8–23)
CO2: 20 mmol/L — ABNORMAL LOW (ref 22–32)
CO2: 27 mmol/L (ref 22–32)
Calcium: 5.3 mg/dL — CL (ref 8.9–10.3)
Calcium: 8 mg/dL — ABNORMAL LOW (ref 8.9–10.3)
Chloride: 118 mmol/L — ABNORMAL HIGH (ref 98–111)
Chloride: 103 mmol/L (ref 98–111)
Creatinine, Ser: 0.42 mg/dL — ABNORMAL LOW (ref 0.44–1.00)
Creatinine, Ser: 0.63 mg/dL (ref 0.44–1.00)
GFR, Estimated: >60 mL/min (ref >60)
GFR, Estimated: >60 mL/min (ref >60)
Glucose, Bld: 78 mg/dL (ref 70–99)
Glucose, Bld: 94 mg/dL (ref 70–99)
Potassium: 2.7 mmol/L — CL (ref 3.5–5.1)
Potassium: 3.8 mmol/L (ref 3.5–5.1)
Sodium: 142 mmol/L (ref 135–145)
Sodium: 137 mmol/L (ref 135–145)
Total Bilirubin: 0.5 mg/dL (ref 0.3–1.2)
Total Bilirubin: 0.6 mg/dL (ref 0.3–1.2)
Total Protein: 3.8 g/dL — ABNORMAL LOW (ref 6.5–8.1)
Total Protein: 5.7 g/dL — ABNORMAL LOW (ref 6.5–8.1)

## 2020-11-11 LAB — RAPID URINE DRUG SCREEN, HOSP PERFORMED
Amphetamines: NOT DETECTED
Barbiturates: NOT DETECTED
Benzodiazepines: NOT DETECTED
Cocaine: NOT DETECTED
Opiates: NOT DETECTED
Tetrahydrocannabinol: NOT DETECTED

## 2020-11-11 LAB — URINALYSIS, ROUTINE W REFLEX MICROSCOPIC
Bilirubin Urine: NEGATIVE
Glucose, UA: NEGATIVE mg/dL
Hgb urine dipstick: NEGATIVE
Ketones, ur: NEGATIVE mg/dL
Leukocytes,Ua: NEGATIVE
Nitrite: NEGATIVE
Protein, ur: NEGATIVE mg/dL
Specific Gravity, Urine: 1.014 (ref 1.005–1.030)
pH: 6 (ref 5.0–8.0)

## 2020-11-11 LAB — MAGNESIUM: Magnesium: 2.2 mg/dL (ref 1.7–2.4)

## 2020-11-11 MED ORDER — DONEPEZIL HCL 10 MG PO TABS: 10.0000 mg | ORAL_TABLET | Freq: Two times a day (BID) | ORAL | 0 refills | Status: AC

## 2020-11-11 MED ORDER — MEMANTINE HCL 10 MG PO TABS: 10.0000 mg | ORAL_TABLET | Freq: Every day | ORAL | 0 refills | Status: DC

## 2020-11-11 MED ORDER — ATORVASTATIN CALCIUM 20 MG PO TABS: 20.0000 mg | ORAL_TABLET | Freq: Every day | ORAL | 0 refills | Status: DC

## 2020-11-11 NOTE — Progress Notes (Signed)
Family Medicine Teaching Service Daily Progress Note Intern Pager: [phone]  Patient name: Dorothy Holland Medical record number: [id number] Date of birth: 11/10/1940 Age: 80 y.o. Gender: female  Primary Care Provider: Cora Collum, DO Consultants: None Code Status: Full  Pt Overview and Major Events to Date:  11/1 - Patient admitted  Assessment and Plan:  Dorothy Holland is a 80 y.o. female presenting with somnolence . PMH is significant for dementia and prediabetes.   Altered Mental Status CBC and BMP stable. Patient likely suffering from Lyrica overdose. Poison control recommended monitoring BP expecting patient may have hypotension and excessive difficulty arousing. Patient BP 130-140's/70-80's this am, with a HR 57-64, sating above 95% on 2 L [place]. Blood culture had no growth to date. -CCM -Q 4hr Neur Checks -PT/OT  Dementia Neurologist in [place]. Home meds below -Continue home Paroxetine -Continue Quetiapine 25 BID (may be TID for aggression) -Continue Citalopram 20 mg daily -Continue Donepezil 10 mg BID daily -Continue Memantine 10 mg daily  Pre-diabetes Last A1c 6.3 02/20/20. CBG range 94-144 today. -Continue home atorvastatin 20 mg daily  FEN/GI: Regular Diet PPx: Lovenox Dispo: Home  pending clinical improvement .   Subjective:  Patient is awake and alert, but confused. Son in law at bedside and says she's at her baseline of cognitive function. He states that she is 50:50 hard to understand because what she says doesn't make sense.  Objective: Temp:  [97.2 F (36.2 C)] 97.2 F (36.2 C) (11/02 0730) Pulse Rate:  [57-66] 64 (11/02 0730) Resp:  [7-18] 18 (11/02 0730) BP: (91-144)/(57-84) 127/64 (11/02 0730) SpO2:  [90 %-100 %] 97 % (11/02 0730) Weight:  [70 kg] 70 kg (11/01 1824) Physical Exam: General: Elderly, polite, good mood, confused, Spanish speaking woman Cardiovascular: RRR, NRMG Respiratory: CTABL Abdomen: Soft, NTTP, ND Extremities:  Pulses intact in all extremities  Laboratory: Recent Labs  Lab 11/10/20 1930 11/11/20 0219 11/11/20 0623  WBC 8.4 5.0 7.1  HGB 14.3 8.7* 13.3  HCT 44.1 28.3* 41.7  PLT 197 112* 177   Recent Labs  Lab 11/10/20 1930 11/11/20 0219 11/11/20 0623  NA 133* 142 137  K 4.8 2.7* 3.8  CL 102 118* 103  CO2 27 20* 27  BUN 21 14 16   CREATININE 0.70 0.42* 0.63  CALCIUM 8.6* 5.3* 8.0*  PROT 6.6 3.8* 5.7*  BILITOT 1.0 0.5 0.6  ALKPHOS 68 38 51  ALT 14 11 16   AST 29 16 23   GLUCOSE 135* 78 94      Imaging/Diagnostic Tests:   , MD 11/11/2020, 7:48 AM PGY-1, [place] [facility] FPTS Intern pager: [phone], text pages welcome

## 2020-11-11 NOTE — Progress Notes (Signed)
Called phlebotomy to touch base about STAT repeat labs. It is now 0556 and my chart shows the STAT repeat CBC and BMP have not yet been drawn.   Phlebotomy reported that the patient refused a repeat lab draw.   I was not notified of this. I will go speak to the patient.   Fayette Pho, MD

## 2020-11-11 NOTE — ED Notes (Signed)
Spoke with receiving nurse who states she will view the chart at this time

## 2020-11-11 NOTE — ED Notes (Signed)
Dorothy Holland is sleeping outside. If you need anything please call his phone first [phone]

## 2020-11-11 NOTE — ED Notes (Signed)
ED TO INPATIENT HANDOFF REPORT  ED Nurse Name and Phone #: Michell Heinrich [phone]  S Name/Age/Gender Dorothy Holland 80 y.o. female Room/Bed: 024C/024C  Code Status   Code Status: Full Code  Home/SNF/Other Home Patient oriented to: self Is this baseline? Yes   Triage Complete: Triage complete  Chief Complaint Accidental overdose of trazodone [T43.211A]  Triage Note Pt from home - has baseline hx dementia - family discovered pt ingested large amt of  lyrica - EMS estimates 2100 based on pills missing. Family reports this happened around 10am, but no symptoms or problems until recently when pt began vomiting profusely so family called EMS. Unsure pt's LKW, family en route. Pt with snoring respirations, responds only to pain at this time.    Allergies Allergies  Allergen Reactions   Penicillins Other (See Comments)    Numbness and shakiness    Level of Care/Admitting Diagnosis ED Disposition     ED Disposition  Admit   Condition  --   Comment  Hospital Area: [facility] [[id number]]  Level of Care: Telemetry Medical [[id number]]  May place patient in observation at [facility] or [facility] if equivalent level of care is available:: No  Covid Evaluation: Asymptomatic Screening Protocol (No Symptoms)  Diagnosis: Accidental overdose of trazodone [[id number]]  Admitting Physician: Fayette Pho [[id number]]  Attending Physician: Moses Manners [[id number]]          B Medical/Surgery History Past Medical History:  Diagnosis Date   Anxiety       A IV Location/Drains/Wounds Patient Lines/Drains/Airways Status     Active Line/Drains/Airways     Name Placement date Placement time Site Days   Peripheral IV 11/10/20 20 G Anterior;Left Forearm 11/10/20  1819  Forearm  1   Peripheral IV 11/10/20 18 G 1" Left Antecubital 11/10/20  1819  Antecubital  1   External Urinary Catheter 11/10/20  1936  --  1            Intake/Output Last 24  hours  Intake/Output Summary (Last 24 hours) at 11/11/2020 1320 Last data filed at 11/10/2020 2259 Gross per 24 hour  Intake 500 ml  Output --  Net 500 ml    Labs/Imaging Results for orders placed or performed during the hospital encounter of 11/10/20 (from the past 48 hour(s))  Salicylate level     Status: Abnormal   Collection Time: 11/10/20  6:05 PM  Result Value Ref Range   Salicylate Lvl <7.0 (L) 7.0 - 30.0 mg/dL    Comment: Performed at [facility], [address] [place], [place] [address]  Ethanol     Status: None   Collection Time: 11/10/20  6:05 PM  Result Value Ref Range   Alcohol, Ethyl (B) <10 <10 mg/dL    Comment: (NOTE) Lowest detectable limit for serum alcohol is 10 mg/dL.  For medical purposes only. Performed at [facility], [address] [place], [place] [address]   Acetaminophen level     Status: Abnormal   Collection Time: 11/10/20  6:05 PM  Result Value Ref Range   Acetaminophen (Tylenol), Serum <10 (L) 10 - 30 ug/mL    Comment: (NOTE) Therapeutic concentrations vary significantly. A range of 10-30 ug/mL  may be an effective concentration for many patients. However, some  are best treated at concentrations outside of this range. Acetaminophen concentrations >150 ug/mL at 4 hours after ingestion  and >50 ug/mL at 12 hours after ingestion are often associated with  toxic reactions.  Performed at [facility], [address] [place], [place] [address]   Resp Panel by RT-PCR (Flu A&B, Covid) Nasopharyngeal Swab     Status: None   Collection Time: 11/10/20  6:05 PM   Specimen: Nasopharyngeal Swab; Nasopharyngeal(NP) swabs in vial transport medium  Result Value Ref Range   SARS Coronavirus 2 by RT PCR NEGATIVE NEGATIVE    Comment: (NOTE) SARS-CoV-2 target nucleic acids are NOT DETECTED.  The SARS-CoV-2 RNA is generally detectable in upper respiratory specimens during the acute phase of infection. The lowest concentration of  SARS-CoV-2 viral copies this assay can detect is 138 copies/mL. A negative result does not preclude SARS-Cov-2 infection and should not be used as the sole basis for treatment or other patient management decisions. A negative result may occur with  improper specimen collection/handling, submission of specimen other than nasopharyngeal swab, presence of viral mutation(s) within the areas targeted by this assay, and inadequate number of viral copies(<138 copies/mL). A negative result must be combined with clinical observations, patient history, and epidemiological information. The expected result is Negative.  Fact Sheet for Patients:  BloggerCourse.com  Fact Sheet for Healthcare Providers:  SeriousBroker.it  This test is no t yet approved or cleared by the [place] FDA and  has been authorized for detection and/or diagnosis of SARS-CoV-2 by FDA under an Emergency Use Authorization (EUA). This EUA will remain  in effect (meaning this test can be used) for the duration of the COVID-19 declaration under Section 564(b)(1) of the Act, 21 U.S.C.section 360bbb-3(b)(1), unless the authorization is terminated  or revoked sooner.       Influenza A by PCR NEGATIVE NEGATIVE   Influenza B by PCR NEGATIVE NEGATIVE    Comment: (NOTE) The Xpert Xpress SARS-CoV-2/FLU/RSV plus assay is intended as an aid in the diagnosis of influenza from Nasopharyngeal swab specimens and should not be used as a sole basis for treatment. Nasal washings and aspirates are unacceptable for Xpert Xpress SARS-CoV-2/FLU/RSV testing.  Fact Sheet for Patients: BloggerCourse.com  Fact Sheet for Healthcare Providers: SeriousBroker.it  This test is not yet approved or cleared by the [place] FDA and has been authorized for detection and/or diagnosis of SARS-CoV-2 by FDA under an Emergency Use Authorization (EUA).  This EUA will remain in effect (meaning this test can be used) for the duration of the COVID-19 declaration under Section 564(b)(1) of the Act, 21 U.S.C. section 360bbb-3(b)(1), unless the authorization is terminated or revoked.  Performed at [facility] Lab, [address] [place], [place] [address]   Lactic acid, plasma     Status: None   Collection Time: 11/10/20  6:16 PM  Result Value Ref Range   Lactic Acid, Venous 1.5 0.5 - 1.9 mmol/L    Comment: Performed at [facility], [address] [place], [place] [address]  Type and screen [facility]     Status: None   Collection Time: 11/10/20  6:30 PM  Result Value Ref Range   ABO/RH(D) O POS    Antibody Screen NEG    Sample Expiration      11/13/2020,2359 Performed at [facility] Lab, [address] [place], [place] [address]   I-stat chem 8, ED (not at [facility] or [facility])     Status: Abnormal   Collection Time: 11/10/20  6:39 PM  Result Value Ref Range   Sodium 138 135 - 145 mmol/L   Potassium 5.2 (H) 3.5 - 5.1 mmol/L   Chloride 101  98 - 111 mmol/L   BUN 33 (H) 8 - 23 mg/dL   Creatinine, Ser 6.60 0.44 - 1.00 mg/dL   Glucose, Bld 630 (H) 70 - 99 mg/dL    Comment: Glucose reference range applies only to samples taken after fasting for at least 8 hours.   Calcium, Ion 1.15 1.15 - 1.40 mmol/L   TCO2 31 22 - 32 mmol/L   Hemoglobin 15.0 12.0 - 15.0 g/dL   HCT 16.0 10.9 - 32.3 %  CBG monitoring, ED     Status: Abnormal   Collection Time: 11/10/20  6:50 PM  Result Value Ref Range   Glucose-Capillary 144 (H) 70 - 99 mg/dL    Comment: Glucose reference range applies only to samples taken after fasting for at least 8 hours.  I-Stat arterial blood gas, ED     Status: Abnormal   Collection Time: 11/10/20  6:58 PM  Result Value Ref Range   pH, Arterial 7.375 7.350 - 7.450   pCO2 arterial 52.4 (H) 32.0 - 48.0 mmHg   pO2, Arterial 81 (L) 83.0 - 108.0 mmHg   Bicarbonate 30.9 (H) 20.0 - 28.0 mmol/L   TCO2 33  (H) 22 - 32 mmol/L   O2 Saturation 96.0 %   Acid-Base Excess 4.0 (H) 0.0 - 2.0 mmol/L   Sodium 137 135 - 145 mmol/L   Potassium 4.1 3.5 - 5.1 mmol/L   Calcium, Ion 1.24 1.15 - 1.40 mmol/L   HCT 41.0 36.0 - 46.0 %   Hemoglobin 13.9 12.0 - 15.0 g/dL   Patient temperature 55.7 F    Sample type ARTERIAL   Troponin I (High Sensitivity)     Status: None   Collection Time: 11/10/20  7:30 PM  Result Value Ref Range   Troponin I (High Sensitivity) 6 <18 ng/L    Comment: (NOTE) Elevated high sensitivity troponin I (hsTnI) values and significant  changes across serial measurements may suggest ACS but many other  chronic and acute conditions are known to elevate hsTnI results.  Refer to the "Links" section for chest pain algorithms and additional  guidance. Performed at [facility], [address] [place], [place] [address]   Comprehensive metabolic panel     Status: Abnormal   Collection Time: 11/10/20  7:30 PM  Result Value Ref Range   Sodium 133 (L) 135 - 145 mmol/L   Potassium 4.8 3.5 - 5.1 mmol/L   Chloride 102 98 - 111 mmol/L   CO2 27 22 - 32 mmol/L   Glucose, Bld 135 (H) 70 - 99 mg/dL    Comment: Glucose reference range applies only to samples taken after fasting for at least 8 hours.   BUN 21 8 - 23 mg/dL   Creatinine, Ser 5.42 0.44 - 1.00 mg/dL   Calcium 8.6 (L) 8.9 - 10.3 mg/dL   Total Protein 6.6 6.5 - 8.1 g/dL   Albumin 3.4 (L) 3.5 - 5.0 g/dL   AST 29 15 - 41 U/L   ALT 14 0 - 44 U/L   Alkaline Phosphatase 68 38 - 126 U/L   Total Bilirubin 1.0 0.3 - 1.2 mg/dL   GFR, Estimated >70 >62 mL/min    Comment: (NOTE) Calculated using the CKD-EPI Creatinine Equation (2021)    Anion gap 4 (L) 5 - 15    Comment: Performed at [facility], [address] [place], [place] [address]  Lipase, blood     Status: None   Collection Time: 11/10/20  7:30 PM  Result Value Ref Range   Lipase 30 11 - 51 U/L    Comment: Performed at [facility], [address]  [place], [place] [address]  Brain natriuretic peptide     Status: None   Collection Time: 11/10/20  7:30 PM  Result Value Ref Range   B Natriuretic Peptide 49.1 0.0 - 100.0 pg/mL    Comment: Performed at [facility] Lab, [address], [place], [place] [address]  CBC with Differential     Status: None   Collection Time: 11/10/20  7:30 PM  Result Value Ref Range   WBC 8.4 4.0 - 10.5 K/uL   RBC 4.48 3.87 - 5.11 MIL/uL   Hemoglobin 14.3 12.0 - 15.0 g/dL   HCT 09.8 11.9 - 14.7 %   MCV 98.4 80.0 - 100.0 fL   MCH 31.9 26.0 - 34.0 pg   MCHC 32.4 30.0 - 36.0 g/dL   RDW 82.9 56.2 - 13.0 %   Platelets 197 150 - 400 K/uL   nRBC 0.0 0.0 - 0.2 %   Neutrophils Relative % 80 %   Neutro Abs 6.7 1.7 - 7.7 K/uL   Lymphocytes Relative 14 %   Lymphs Abs 1.2 0.7 - 4.0 K/uL   Monocytes Relative 4 %   Monocytes Absolute 0.4 0.1 - 1.0 K/uL   Eosinophils Relative 1 %   Eosinophils Absolute 0.1 0.0 - 0.5 K/uL   Basophils Relative 0 %   Basophils Absolute 0.0 0.0 - 0.1 K/uL   Immature Granulocytes 1 %   Abs Immature Granulocytes 0.04 0.00 - 0.07 K/uL    Comment: Performed at [facility], [address], [place], [place] [address]  Protime-INR     Status: None   Collection Time: 11/10/20  7:30 PM  Result Value Ref Range   Prothrombin Time 14.1 11.4 - 15.2 seconds   INR 1.1 0.8 - 1.2    Comment: (NOTE) INR goal varies based on device and disease states. Performed at [facility], [address], [place], [place] [address]   ABO/Rh     Status: None   Collection Time: 11/10/20  7:30 PM  Result Value Ref Range   ABO/RH(D)      O POS Performed at [facility], [address], [place], [place] [address]   Culture, blood (routine x 2)     Status: None (Preliminary result)   Collection Time: 11/10/20  9:32 PM   Specimen: BLOOD RIGHT HAND  Result Value Ref Range   Specimen Description BLOOD RIGHT HAND    Special Requests      BOTTLES DRAWN AEROBIC AND ANAEROBIC Blood Culture  adequate volume   Culture      NO GROWTH < 12 HOURS Performed at [facility], [address] [place], [place] [address]    Report Status PENDING   Troponin I (High Sensitivity)     Status: None   Collection Time: 11/10/20  9:32 PM  Result Value Ref Range   Troponin I (High Sensitivity) 5 <18 ng/L    Comment: (NOTE) Elevated high sensitivity troponin I (hsTnI) values and significant  changes across serial measurements may suggest ACS but many other  chronic and acute conditions are known to elevate hsTnI results.  Refer to the "Links" section for chest pain algorithms and additional  guidance. Performed at [facility] Lab, [address] [place], [place] [address]   Magnesium     Status: None  Collection Time: 11/10/20  9:32 PM  Result Value Ref Range   Magnesium 2.2 1.7 - 2.4 mg/dL    Comment: Performed at [facility], [address] [place], [place] [address]  Comprehensive metabolic panel     Status: Abnormal   Collection Time: 11/11/20  2:19 AM  Result Value Ref Range   Sodium 142 135 - 145 mmol/L    Comment: DELTA CHECK NOTED   Potassium 2.7 (LL) 3.5 - 5.1 mmol/L    Comment: CRITICAL RESULT CALLED TO, READ BACK BY AND VERIFIED WITH: Tawana Scale RN 11/11/20 0402 M KOROLESKI    Chloride 118 (H) 98 - 111 mmol/L   CO2 20 (L) 22 - 32 mmol/L   Glucose, Bld 78 70 - 99 mg/dL    Comment: Glucose reference range applies only to samples taken after fasting for at least 8 hours.   BUN 14 8 - 23 mg/dL   Creatinine, Ser 6.04 (L) 0.44 - 1.00 mg/dL   Calcium 5.3 (LL) 8.9 - 10.3 mg/dL    Comment: CRITICAL RESULT CALLED TO, READ BACK BY AND VERIFIED WITH: Tawana Scale RN 11/11/20 0402 M KOROLESKI    Total Protein 3.8 (L) 6.5 - 8.1 g/dL   Albumin 1.9 (L) 3.5 - 5.0 g/dL   AST 16 15 - 41 U/L   ALT 11 0 - 44 U/L   Alkaline Phosphatase 38 38 - 126 U/L   Total Bilirubin 0.5 0.3 - 1.2 mg/dL   GFR, Estimated >54 >09 mL/min    Comment: (NOTE) Calculated using the CKD-EPI  Creatinine Equation (2021)    Anion gap 4 (L) 5 - 15    Comment: Performed at [facility] Lab, [address] [place], [place] [address]  CBC     Status: Abnormal   Collection Time: 11/11/20  2:19 AM  Result Value Ref Range   WBC 5.0 4.0 - 10.5 K/uL   RBC 2.73 (L) 3.87 - 5.11 MIL/uL   Hemoglobin 8.7 (L) 12.0 - 15.0 g/dL    Comment: REPEATED TO VERIFY   HCT 28.3 (L) 36.0 - 46.0 %   MCV 103.7 (H) 80.0 - 100.0 fL   MCH 31.9 26.0 - 34.0 pg   MCHC 30.7 30.0 - 36.0 g/dL   RDW 47.8 29.5 - 62.1 %   Platelets 112 (L) 150 - 400 K/uL    Comment: Immature Platelet Fraction may be clinically indicated, consider ordering this additional test [id number] REPEATED TO VERIFY PLATELET COUNT CONFIRMED BY SMEAR    nRBC 0.0 0.0 - 0.2 %    Comment: Performed at [facility], [address], [place], [place] [address]  RPR     Status: None   Collection Time: 11/11/20  2:25 AM  Result Value Ref Range   RPR Ser Ql NON REACTIVE NON REACTIVE    Comment: Performed at [facility], [address] [place], [place] [address]  Comprehensive metabolic panel     Status: Abnormal   Collection Time: 11/11/20  6:23 AM  Result Value Ref Range   Sodium 137 135 - 145 mmol/L   Potassium 3.8 3.5 - 5.1 mmol/L    Comment: REPEATED TO VERIFY   Chloride 103 98 - 111 mmol/L   CO2 27 22 - 32 mmol/L   Glucose, Bld 94 70 - 99 mg/dL    Comment: Glucose reference range applies only to samples taken after fasting for at least 8 hours.   BUN 16 8 - 23  mg/dL   Creatinine, Ser 6.30 0.44 - 1.00 mg/dL   Calcium 8.0 (L) 8.9 - 10.3 mg/dL    Comment: REPEATED TO VERIFY   Total Protein 5.7 (L) 6.5 - 8.1 g/dL   Albumin 2.9 (L) 3.5 - 5.0 g/dL   AST 23 15 - 41 U/L   ALT 16 0 - 44 U/L   Alkaline Phosphatase 51 38 - 126 U/L   Total Bilirubin 0.6 0.3 - 1.2 mg/dL   GFR, Estimated >16 >01 mL/min    Comment: (NOTE) Calculated using the CKD-EPI Creatinine Equation (2021)    Anion gap 7 5 - 15    Comment: Performed at  [facility], [address] [place], [place] [address]  CBC     Status: None   Collection Time: 11/11/20  6:23 AM  Result Value Ref Range   WBC 7.1 4.0 - 10.5 K/uL   RBC 4.19 3.87 - 5.11 MIL/uL   Hemoglobin 13.3 12.0 - 15.0 g/dL    Comment: REPEATED TO VERIFY AGREES WITH EARLIER RESULTS    HCT 41.7 36.0 - 46.0 %   MCV 99.5 80.0 - 100.0 fL   MCH 31.7 26.0 - 34.0 pg   MCHC 31.9 30.0 - 36.0 g/dL   RDW 55.7 32.2 - 02.5 %   Platelets 177 150 - 400 K/uL   nRBC 0.0 0.0 - 0.2 %    Comment: Performed at [facility] Lab, [address] [place], [place] [address]  Urinalysis, Routine w reflex microscopic Urine, Clean Catch     Status: None   Collection Time: 11/11/20  7:06 AM  Result Value Ref Range   Color, Urine YELLOW YELLOW   APPearance CLEAR CLEAR   Specific Gravity, Urine 1.014 1.005 - 1.030   pH 6.0 5.0 - 8.0   Glucose, UA NEGATIVE NEGATIVE mg/dL   Hgb urine dipstick NEGATIVE NEGATIVE   Bilirubin Urine NEGATIVE NEGATIVE   Ketones, ur NEGATIVE NEGATIVE mg/dL   Protein, ur NEGATIVE NEGATIVE mg/dL   Nitrite NEGATIVE NEGATIVE   Leukocytes,Ua NEGATIVE NEGATIVE    Comment: Performed at [facility] Lab, [address], [place], [place] [address]  Urine rapid drug screen (hosp performed)     Status: None   Collection Time: 11/11/20  7:06 AM  Result Value Ref Range   Opiates NONE DETECTED NONE DETECTED   Cocaine NONE DETECTED NONE DETECTED   Benzodiazepines NONE DETECTED NONE DETECTED   Amphetamines NONE DETECTED NONE DETECTED   Tetrahydrocannabinol NONE DETECTED NONE DETECTED   Barbiturates NONE DETECTED NONE DETECTED    Comment: (NOTE) DRUG SCREEN FOR MEDICAL PURPOSES ONLY.  IF CONFIRMATION IS NEEDED FOR ANY PURPOSE, NOTIFY LAB WITHIN 5 DAYS.  LOWEST DETECTABLE LIMITS FOR URINE DRUG SCREEN Drug Class                     Cutoff (ng/mL) Amphetamine and metabolites    1000 Barbiturate and metabolites    200 Benzodiazepine                 200 Tricyclics and  metabolites     300 Opiates and metabolites        300 Cocaine and metabolites        300 THC                            50 Performed at [facility] Lab, [address], [place], [place] [address]  CT Head Wo Contrast  Result Date: 11/10/2020 CLINICAL DATA:  Mental status change.  History of dementia. EXAM: CT HEAD WITHOUT CONTRAST TECHNIQUE: Contiguous axial images were obtained from the base of the skull through the vertex without intravenous contrast. COMPARISON:  None. FINDINGS: Brain: Age related atrophy no intracranial hemorrhage, mass effect, or midline shift. No hydrocephalus. The basilar cisterns are patent. Partially empty sella, typically incidental. No evidence of territorial infarct or acute ischemia. No extra-axial or intracranial fluid collection. Vascular: Atherosclerosis of skullbase vasculature without hyperdense vessel or abnormal calcification. Skull: No fracture or focal lesion. Sinuses/Orbits: Paranasal sinuses and mastoid air cells are clear. The visualized orbits are unremarkable. Other: None. IMPRESSION: 1. No acute intracranial abnormality. 2. Age related atrophy. Electronically Signed   By: Narda Rutherford M.D.   On: 11/10/2020 18:48   DG Chest Port 1 View  Result Date: 11/10/2020 CLINICAL DATA:  Dyspnea. Pt from home - has baseline hx dementia - family discovered pt ingested large amt of 75mg  lyrica - Family reports this happened around 10am, but no symptoms or problems until recently when pt began vomiting profusely so family called EMS. EXAM: PORTABLE CHEST 1 VIEW COMPARISON:  12/23/2010 FINDINGS: Cardiac silhouette is normal in size. No mediastinal or hilar masses. Lungs demonstrate interstitial prominence, most evident in the lower lungs. No lung consolidation to suggest pneumonia. No evidence of pulmonary edema. No convincing pleural effusion and no pneumothorax. Skeletal structures are demineralized but grossly intact. IMPRESSION: No acute cardiopulmonary  disease. Electronically Signed   By: Amie Portland M.D.   On: 11/10/2020 18:32    Pending Labs Unresulted Labs (From admission, onward)     Start     Ordered   11/10/20 1806  Culture, blood (routine x 2)  BLOOD CULTURE X 2,   STAT      11/10/20 1806            Vitals/Pain Today's Vitals   11/11/20 0500 11/11/20 0600 11/11/20 0730 11/11/20 1040  BP: 134/71 (!) 144/74 127/64 133/61  Pulse: 63 63 64 70  Resp:  16 18 16   Temp:   (!) 97.2 F (36.2 C)   TempSrc:   Oral   SpO2: 100% 90% 97% 97%  Weight:      Height:      PainSc:        Isolation Precautions No active isolations  Medications Medications  PARoxetine (PAXIL) tablet 20 mg (20 mg Oral Given 11/11/20 1015)  citalopram (CELEXA) tablet 20 mg (20 mg Oral Given 11/11/20 1014)  atorvastatin (LIPITOR) tablet 20 mg (20 mg Oral Given 11/11/20 1014)  donepezil (ARICEPT) tablet 10 mg (10 mg Oral Given 11/11/20 1015)  memantine (NAMENDA) tablet 10 mg (10 mg Oral Given 11/11/20 1015)  enoxaparin (LOVENOX) injection 40 mg (40 mg Subcutaneous Given 11/11/20 1014)  naloxone (NARCAN) injection 0.4 mg (0.4 mg Intravenous Given 11/10/20 1844)  sodium chloride 0.9 % bolus 500 mL (0 mLs Intravenous Stopped 11/10/20 2259)    Mobility walks with person assist High fall risk   Focused Assessments Neuro Assessment Handoff:  Swallow screen pass? Yes  Cardiac Rhythm: Normal sinus rhythm       Neuro Assessment: Exceptions to WDL Neuro Checks:      Last Documented NIHSS Modified Score:   Has TPA been given? No If patient is a Neuro Trauma and patient is going to OR before floor call report to 4N Charge nurse: [phone] or [phone]   R Recommendations: See Admitting Provider Note  Report  given to:   Additional Notes: Spanish speaking, family at bedside. Pt is alert to self at baseline due to dementia.

## 2020-11-11 NOTE — ED Notes (Signed)
Pt cardiac monitor cords were changed to d/t irregular rhythms. Pt currently sinus brady 59.

## 2020-11-11 NOTE — Evaluation (Signed)
Occupational Therapy Evaluation Patient Details Name: Dorothy Holland MRN: [id number] DOB: 1940-01-14 Today's Date: 11/11/2020   History of Present Illness Pt is an 80 y/o female admitted 11/1 secondary to accidental overdose of lyrica. PMH includes dementia and pre-diabetes.   Clinical Impression   Patient admitted for the diagnosis above.  PTA she has 24 hour assist as needed from family at home.  Her son states she a little more confused, and a little more unsteady than baseline, but she is close, so he intends on taking her back home.  No acute OT needs identified, and son declines HH services.        Recommendations for follow up therapy are one component of a multi-disciplinary discharge planning process, led by the attending physician.  Recommendations may be updated based on patient status, additional functional criteria and insurance authorization.   Follow Up Recommendations  No OT follow up    Assistance Recommended at Discharge Frequent or constant Supervision/Assistance  Functional Status Assessment  Patient has had a recent decline in their functional status and demonstrates the ability to make significant improvements in function in a reasonable and predictable amount of time.  Equipment Recommendations  None recommended by OT    Recommendations for Other Services       Precautions / Restrictions Precautions Precautions: Fall Restrictions Weight Bearing Restrictions: No      Mobility Bed Mobility Overal bed mobility: Needs Assistance Bed Mobility: Supine to Sit;Sit to Supine     Supine to sit: Supervision Sit to supine: Min guard   General bed mobility comments: Min A for trunk elevation to come to sitting. Pt not responding to cues to sit, so RN had to come in and assist with returning pt to supine safely.    Transfers Overall transfer level: Needs assistance Equipment used: 1 person hand held assist Transfers: Sit to/from [employer] Sit to Stand: Supervision Stand pivot transfers: Min guard         General transfer comment: Min guard for safety to stand from stretcher. Pt very concerned about her gown and would not follow multimodal cues to take steps. Pt also not wanting to sit down, so required RN assist to safely return to sitting and then to supine.      Balance Overall balance assessment: Needs assistance Sitting-balance support: No upper extremity supported;Feet supported Sitting balance-Leahy Scale: Fair     Standing balance support: Single extremity supported Standing balance-Leahy Scale: Fair Standing balance comment: Reliant on at least 1 UE support                           ADL either performed or assessed with clinical judgement   ADL   Eating/Feeding: Set up;Sitting               Upper Body Dressing : Min guard;Sitting   Lower Body Dressing: Minimal assistance;Sit to/from stand   Toilet Transfer: Min guard                   Vision Patient Visual Report: No change from baseline       Perception Perception Perception: Not tested   Praxis Praxis Praxis: Not tested    Pertinent Vitals/Pain Pain Assessment: No/denies pain     Hand Dominance Right   Extremity/Trunk Assessment Upper Extremity Assessment Upper Extremity Assessment: Overall WFL for tasks assessed   Lower Extremity Assessment Lower Extremity Assessment: Defer to PT evaluation   Cervical /  Trunk Assessment Cervical / Trunk Assessment: Normal   Communication Communication Communication: Prefers language other than English   Cognition Arousal/Alertness: Awake/alert Behavior During Therapy: Restless Overall Cognitive Status: History of cognitive impairments - at baseline                                 General Comments: Dementia at baseline. Very restless. Did not respond well to interpreter and had difficulty with command following.     General Comments  Pt's  son present at end of session.    Exercises     Shoulder Instructions      Home Living Family/patient expects to be discharged to:: Private residence Living Arrangements: Children;Spouse/significant other Available Help at Discharge: Family;Available 24 hours/day Type of Home: House Home Access: Stairs to enter [employer] of Steps: 2 Entrance Stairs-Rails: None Home Layout: Two level Alternate Level Stairs-Number of Steps: flight Alternate Level Stairs-Rails: Left Bathroom Shower/Tub: Chief Strategy Officer: Standard     Home Equipment: Cane - single point;Shower seat          Prior Functioning/Environment Prior Level of Function : Needs assist  Cognitive Assist : ADLs (cognitive);Mobility (cognitive) Mobility (Cognitive): Intermittent cues ADLs (Cognitive): Intermittent cues Physical Assist : Mobility (physical) Mobility (physical): Stairs   Mobility Comments: Independent with ambulation normally per son. Reports he has to assist with stairs occasionally. ADLs Comments: Pt's son reports pt requires assist with turning on water in tub and supervision for safety with showering.        OT Problem List: Impaired balance (sitting and/or standing);Decreased safety awareness      OT Treatment/Interventions:      OT Goals(Current goals can be found in the care plan section) Acute Rehab OT Goals Patient Stated Goal: Son will take her home OT Goal Formulation: With patient Time For Goal Achievement: 11/11/20 Potential to Achieve Goals: Good  OT Frequency:     Barriers to D/C:            Co-evaluation              AM-PAC OT "6 Clicks" Daily Activity     Outcome Measure Help from another person eating meals?: None Help from another person taking care of personal grooming?: A Little Help from another person toileting, which includes using toliet, bedpan, or urinal?: A Little Help from another person bathing (including washing, rinsing,  drying)?: A Little Help from another person to put on and taking off regular upper body clothing?: A Little Help from another person to put on and taking off regular lower body clothing?: A Little 6 Click Score: 19   End of Session Equipment Utilized During Treatment: Gait belt Nurse Communication: Mobility status  Activity Tolerance: Patient tolerated treatment well Patient left: in bed;with call bell/phone within reach;with family/visitor present  OT Visit Diagnosis: Unsteadiness on feet (R26.81)                Time: 8099-8338 OT Time Calculation (min): 20 min Charges:  OT General Charges $OT Visit: 1 Visit OT Evaluation $OT Eval Moderate Complexity: 1 Mod  11/11/2020  RP, OTR/L  Acute Rehabilitation Services  Office:  [phone]   Suzanna Obey 11/11/2020, 11:10 AM

## 2020-11-11 NOTE — ED Notes (Signed)
Spoke with Rounding MD. States they will get pt discharged later this evening

## 2020-11-11 NOTE — Progress Notes (Signed)
Received page 0410 that labs came back abnormal with low Ca, low , and significant drop in Hgb. Ordered STAT CBC and BMP at 0420. It is now 0517 and the labs have not yet been drawn. Contacted the Ed phlebotomist who reports patient is a hard stick and another phlebotomist will attempt to draw after she finishes in the restroom.   Fayette Pho, MD

## 2020-11-11 NOTE — Evaluation (Signed)
Physical Therapy Evaluation Patient Details Name: Dorothy Holland MRN: [id number] DOB: 17-Apr-1940 Today's Date: 11/11/2020  History of Present Illness  Pt is an 80 y/o female admitted 11/1 secondary to accidental overdose of lyrica. PMH includes dementia and pre-diabetes.  Clinical Impression  Pt admitted secondary to problem above with deficits below. Pt initially responding well to cues and interpreter to perform mobility tasks. Required min A to sit up and min guard to stand. However, once standing, pt became very distracted by her gown and would not respond to cues to take steps. Also did not respond to cues to return to sitting, so required RN assist to safely return to sitting and supine. Son present at end of session and reports plan is to return home. Recommending HHPT at d/c to ensure safety with mobility tasks. Will continue to follow acutely.        Recommendations for follow up therapy are one component of a multi-disciplinary discharge planning process, led by the attending physician.  Recommendations may be updated based on patient status, additional functional criteria and insurance authorization.  Follow Up Recommendations Home health PT    Assistance Recommended at Discharge Frequent or constant Supervision/Assistance  Functional Status Assessment Patient has had a recent decline in their functional status and demonstrates the ability to make significant improvements in function in a reasonable and predictable amount of time.  Equipment Recommendations  Rolling walker (2 wheels)    Recommendations for Other Services       Precautions / Restrictions Precautions Precautions: Fall Restrictions Weight Bearing Restrictions: No      Mobility  Bed Mobility Overal bed mobility: Needs Assistance Bed Mobility: Supine to Sit;Sit to Supine     Supine to sit: Min assist Sit to supine: Mod assist;+2 for physical assistance   General bed mobility comments: Min A for  trunk elevation to come to sitting. Pt not responding to cues to sit, so RN had to come in and assist with returning pt to supine safely.    Transfers Overall transfer level: Needs assistance Equipment used: 1 person hand held assist Transfers: Sit to/from Stand Sit to Stand: Min guard           General transfer comment: Min guard for safety to stand from stretcher. Pt very concerned about her gown and would not follow multimodal cues to take steps. Pt also not wanting to sit down, so required RN assist to safely return to sitting and then to supine.    Ambulation/Gait                Stairs            Wheelchair Mobility    Modified Rankin (Stroke Patients Only)       Balance Overall balance assessment: Needs assistance Sitting-balance support: No upper extremity supported;Feet supported Sitting balance-Leahy Scale: Fair     Standing balance support: Single extremity supported Standing balance-Leahy Scale: Poor Standing balance comment: Reliant on at least 1 UE support                             Pertinent Vitals/Pain Pain Assessment: No/denies pain    Home Living Family/patient expects to be discharged to:: Private residence Living Arrangements: Children;Spouse/significant other Available Help at Discharge: Family;Available 24 hours/day Type of Home: House Home Access: Stairs to enter Entrance Stairs-Rails: None Entrance Stairs-Number of Steps: 2 Alternate Level Stairs-Number of Steps: flight Home Layout: Two level Home Equipment: Cane -  single point;Shower seat      Prior Function Prior Level of Function : Needs assist  Cognitive Assist : ADLs (cognitive);Mobility (cognitive) Mobility (Cognitive): Intermittent cues ADLs (Cognitive): Intermittent cues Physical Assist : Mobility (physical) Mobility (physical): Stairs   Mobility Comments: Independent with ambulation normally per son. Reports he has to assist with stairs  occasionally. ADLs Comments: Pt's son reports pt requires assist with turning on water in tub and supervision for safety with showering.     Hand Dominance        Extremity/Trunk Assessment   Upper Extremity Assessment Upper Extremity Assessment: Defer to OT evaluation    Lower Extremity Assessment Lower Extremity Assessment: Generalized weakness    Cervical / Trunk Assessment Cervical / Trunk Assessment: Normal  Communication   Communication: Prefers language other than [place];Interpreter utilized [phone])  Cognition Arousal/Alertness: Awake/alert Behavior During Therapy: Restless Overall Cognitive Status: History of cognitive impairments - at baseline                                 General Comments: Dementia at baseline. Very restless. Did not respond well to interpreter and had difficulty with command following.        General Comments General comments (skin integrity, edema, etc.): Pt's son present at end of session.    Exercises     Assessment/Plan    PT Assessment Patient needs continued PT services  PT Problem List Decreased strength;Decreased activity tolerance;Decreased balance;Decreased mobility;Decreased knowledge of use of DME;Decreased cognition;Decreased safety awareness;Decreased knowledge of precautions       PT Treatment Interventions DME instruction;Gait training;Functional mobility training;Therapeutic exercise;Stair training;Therapeutic activities;Neuromuscular re-education;Balance training;Patient/family education;Cognitive remediation    PT Goals (Current goals can be found in the Care Plan section)  Acute Rehab PT Goals Patient Stated Goal: to return home per son PT Goal Formulation: With family Time For Goal Achievement: 11/25/20 Potential to Achieve Goals: Fair    Frequency Min 3X/week   Barriers to discharge        Co-evaluation               AM-PAC PT "6 Clicks" Mobility  Outcome Measure Help  needed turning from your back to your side while in a flat bed without using bedrails?: A Little Help needed moving from lying on your back to sitting on the side of a flat bed without using bedrails?: A Little Help needed moving to and from a bed to a chair (including a wheelchair)?: A Little Help needed standing up from a chair using your arms (e.g., wheelchair or bedside chair)?: A Little Help needed to walk in hospital room?: A Lot Help needed climbing 3-5 steps with a railing? : A Lot 6 Click Score: 16    End of Session Equipment Utilized During Treatment: Gait belt Activity Tolerance: Patient tolerated treatment well Patient left: in bed;with call bell/phone within reach;with nursing/sitter in room (on stretcher in ED) Nurse Communication: Mobility status PT Visit Diagnosis: Unsteadiness on feet (R26.81);Muscle weakness (generalized) (M62.81)    Time: 7989-2119 PT Time Calculation (min) (ACUTE ONLY): 25 min   Charges:   PT Evaluation $PT Eval Moderate Complexity: 1 Mod PT Treatments $Therapeutic Activity: 8-22 mins        Cindee Salt, DPT  Acute Rehabilitation Services  Pager: [phone] Office: [phone]   Lehman Prom 11/11/2020, 10:04 AM

## 2020-11-11 NOTE — ED Notes (Addendum)
Staffing office contacted for sitter, none available at this time.

## 2020-11-11 NOTE — Discharge Instructions (Addendum)
Dear Ortencia Kick,   Thank you so much for allowing [place] to be part of your care!  You were admitted to [facility] for an overdose on the medication 'Lyrica', causing a change in your mental status. Please be careful and have family manage access to medications.   POST-HOSPITAL & CARE INSTRUCTIONS Continue to take your home medications: Citalopram (Celexa) 20 mg daily Donepezil (Aricept) 10 mg, twice a day Memantine (Namenda) 10 mg daily Paroxetine (Paxil) 20 mg daily Please follow up with your personal doctor within a week of leaving the hospital  DOCTOR'S APPOINTMENT & FOLLOW UP CARE INSTRUCTIONS  No future appointments.  RETURN PRECAUTIONS: Please return if: -Mental status changes (decreased responsiveness, increased confusion, disorganized speech) -Concern for accidental ingestion  Take care and be well!  Family Medicine Teaching Service  [place]  [facility]  [address] [place], [place] [address] [phone]

## 2020-11-11 NOTE — ED Notes (Signed)
Pt is making audible snoring sounds while sleeping with mouth open. Holitio son in law stated this is how she normally rest at home. Pt is currently on 2L nasal canula

## 2020-11-12 ENCOUNTER — Telehealth: Payer: Self-pay

## 2020-11-12 NOTE — Discharge Summary (Addendum)
Family Medicine Teaching [facility] Discharge Summary  Patient name: Dorothy Holland Medical record number: [id number] Date of birth: 02-01-1940 Age: 80 y.o. Gender: female Date of Admission: 11/10/2020  Date of Discharge: 11/11/20 Admitting Physician: Fayette Pho, MD  Primary Care Provider: Cora Collum, DO Consultants: None  Indication for Hospitalization: Somnolence and suspected Lyrica overdose  Discharge Diagnoses/Problem List:  Altered mental status  Dementia Prediabetes  Disposition: Home  Discharge Condition: Improved  Discharge Exam:  Temp:  [97.2 F (36.2 C)] 97.2 F (36.2 C) (11/02 0730) Pulse Rate:  [57-66] 64 (11/02 0730) Resp:  [7-18] 18 (11/02 0730) BP: (91-144)/(57-84) 127/64 (11/02 0730) SpO2:  [90 %-100 %] 97 % (11/02 0730) Weight:  [70 kg] 70 kg (11/01 1824) Physical Exam: General: Elderly, polite, good mood, confused, Spanish speaking woman Cardiovascular: RRR, NRMG Respiratory: CTABL Abdomen: Soft, NTTP, ND Extremities: Pulses intact in all extremities  Brief Hospital Course:  Dorothy Holland is a 80 y.o. female presenting with somnolence . PMH is significant for dementia and prediabetes. Her hospital course is as follows:   Altered mental status Patient presented to ED via EMS for somnolence due to suspected accidental Lyrica overdose.  Per family at bedside in the ED, she may have ingested around 2100 mg Lyrica morning and become somnolent sometime around 1 PM.  On presentation to the ED, she was arousable to pain with stable vital signs and borderline bradycardia with heart rate in the 50s-60s.  She was protecting her airway, afebrile, and had normal sinus rhythm on EKG.  Chest x-ray and head CT unremarkable.  CBC, CMP and BNP largely within normal limits.  Troponins, lactic acid, salicylate, and ethanol levels negative.  UDS was  negative.  Physical exam remarkable for right pupil nonreactive to light, however stable while in  ED.  ED contacted poison control, who recommended EKG for bradycardia and BP monitoring for hypotension. Patient blood pressure remained stable during admission. Her bradycardia resolved and she had a normal EKG. By time of discharge patient's son-in-law stated she was back to baseline.   Dementia Home meds were continued in the hospital: Paroxetine hydrochloride 20 mg daily, Quetiapine 25 mg BID (may give up to TID if aggressive), Citalopram 20 mg daily, Donepezil 10 mg BID, and Memantine 10 mg daily. By end of admission patient son in law had reported that she was at her baseline level of mentation and activity. He reports that she is 50:50 hard to understand because of her confusion at baseline.   Pre-diabetes Patient last A1c was 6.3 on 02/20/20. CBG range while in the hospital was 94-144. Her home atorvastatin was continued while admitted.    Issues for Follow Up:  Dementia medication regimen  Significant Procedures: None  Significant Labs and Imaging:  Recent Labs  Lab 11/10/20 1930 11/11/20 0219 11/11/20 0623  WBC 8.4 5.0 7.1  HGB 14.3 8.7* 13.3  HCT 44.1 28.3* 41.7  PLT 197 112* 177   Recent Labs  Lab 11/10/20 1839 11/10/20 1858 11/10/20 1930 11/10/20 2132 11/11/20 0219 11/11/20 0623  NA 138 137 133*  --  142 137  K 5.2* 4.1 4.8  --  2.7* 3.8  CL 101  --  102  --  118* 103  CO2  --   --  27  --  20* 27  GLUCOSE 138*  --  135*  --  78 94  BUN 33*  --  21  --  14 16  CREATININE 0.70  --  0.70  --  0.42* 0.63  CALCIUM  --   --  8.6*  --  5.3* 8.0*  MG  --   --   --  2.2  --   --   ALKPHOS  --   --  68  --  38 51  AST  --   --  29  --  16 23  ALT  --   --  14  --  11 16  ALBUMIN  --   --  3.4*  --  1.9* 2.9*      Results/Tests Pending at Time of Discharge: None  Discharge Medications:  Allergies as of 11/11/2020       Reactions   Penicillins Other (See Comments)   Numbness and shakiness        Medication List     TAKE these medications     atorvastatin 20 MG tablet Commonly known as: LIPITOR Take 1 tablet (20 mg total) by mouth daily. What changed: when to take this   citalopram 20 MG tablet Commonly known as: CELEXA Take 20 mg by mouth daily.   donepezil 10 MG tablet Commonly known as: ARICEPT Take 1 tablet (10 mg total) by mouth 2 (two) times daily. What changed:  how much to take when to take this additional instructions   memantine 10 MG tablet Commonly known as: NAMENDA Take 1 tablet (10 mg total) by mouth daily. What changed:  when to take this additional instructions   PARoxetine 20 MG tablet Commonly known as: PAXIL Take 20 mg by mouth daily.   QUEtiapine 25 MG tablet Commonly known as: SEROQUEL Take 25 mg by mouth 2 (two) times daily.        Discharge Instructions: Please refer to Patient Instructions section of EMR for full details.  Patient was counseled important signs and symptoms that should prompt return to medical care, changes in medications, dietary instructions, activity restrictions, and follow up appointments.    Bess Kinds, MD 11/12/2020, 2:10 AM PGY-1, [facility] Family Medicine

## 2020-11-12 NOTE — Telephone Encounter (Signed)
Transition Care Management Follow-up Telephone Call Date of discharge and from where: 11/11/2020 from [facility] How have you been since you were released from the hospital? Pt son stated that pt is doing well. Pt is eating and drinking no N/V or cp, sob or doe.  Any questions or concerns? No  Items Reviewed: Did the pt receive and understand the discharge instructions provided? No  Medications obtained and verified? Yes  Other? No  Any new allergies since your discharge? No  Dietary orders reviewed? No Do you have support at home? Yes   Functional Questionnaire: (I = Independent and D = Dependent) ADLs: I  Bathing/Dressing- I  Meal Prep- I  Eating- I  Maintaining continence- I  Transferring/Ambulation- I  Managing Meds- I   Follow up appointments reviewed:  PCP Hospital f/u appt confirmed? No   Specialist Hospital f/u appt confirmed? No   Are transportation arrangements needed? No  If their condition worsens, is the pt aware to call PCP or go to the Emergency Dept.? Yes Was the patient provided with contact information for the PCP's office or ED? Yes Was to pt encouraged to call back with questions or concerns? Yes

## 2020-11-12 NOTE — Hospital Course (Addendum)
Dorothy Holland is a 80 y.o. female presenting with somnolence . PMH is significant for dementia and prediabetes. Her hospital course is as follows:   Altered mental status Patient presented to ED via EMS for somnolence due to suspected accidental Lyrica overdose.  Per family at bedside in the ED, she may have ingested around 2100 mg Lyrica late this morning and become somnolent sometime around 1 PM.  On presentation to the ED, she was arousable to pain with stable vital signs and borderline bradycardia with heart rate in the 50s-60s.  She was protecting her airway, afebrile, and had normal sinus rhythm on EKG.  Chest x-ray and head CT unremarkable.  CBC, CMP and BNP largely within normal limits.  Troponins, lactic acid, salicylate, and ethanol levels negative.  UDS was    .  Physical exam remarkable for right pupil nonreactive to light, however stable while in ED.  ED contacted poison control, who recommended EKG for bradycardia and BP monitoring for hypotension. Patient blood pressure remained stable during admission. Her bradycardia resolved and she had a normal EKG. By time of discharge patient's father in law stated she was back to herself.   Dementia Home meds were continued in the hospital: Paroxetine hydrochloride 20 mg daily, Quetiapine 25 mg BID (may give up to TID if aggressive), Citalopram 20 mg daily, Donepezil 10 mg BID, and Memantine 10 mg daily. By end of admission patient son in law had reported that she was at her baseline level of mentation and activity. He reports that she is 50:50 hard to understand because of her confusion at baseline.   Pre-diabetes Patient last A1c was 6.3 on 02/20/20. CBG range while in the hospital was 94-144. Her home atorvastatin was continued while admitted.

## 2020-11-15 LAB — CULTURE, BLOOD (ROUTINE X 2)
Culture: NO GROWTH
Special Requests: ADEQUATE

## 2020-12-22 ENCOUNTER — Other Ambulatory Visit: Payer: Self-pay | Admitting: Family Medicine

## 2020-12-25 ENCOUNTER — Other Ambulatory Visit: Payer: Self-pay | Admitting: Family Medicine

## 2021-02-02 NOTE — Progress Notes (Signed)
° ° °  SUBJECTIVE:   Chief compliant/HPI: annual examination  Dorothy Holland is a 81 y.o. who presents today for an annual exam.   Activity level and Functional status:  Mobility/Ambulation: patient is ambulatory in the home without any assistive devices Transfers: transfers by herself Eating: no problem eating Bladder: continent Bowel: continent  ADLs / IADLs:  Activities of Daily Living - Current Assessment  Patient does all dressing, bathing, toileting by herself Instrumental Activities of Daily Living - Current Assessment Patient does not complete any IADLs by herself. Her husband, daughter, and son-in-law pay bills, schedule appointments, drive her, shop, and prepare food.  Cogntive and Mood functions:  Geriatric Depression Scale: PHQ-2 score 1 Advanced Directives: No AD or HCPOA. Discussed this and idea of code status with husband and SIL today. Gave SIL advanced care packet.  History tabs reviewed and updated.   Review of systems form reviewed and not notable, overall reports feeling well.   OBJECTIVE:   BP 126/64    Pulse 84    Wt 162 lb (73.5 kg)    BMI 31.64 kg/m   Nursing note and vitals reviewed GEN: age-appropriate, LW, resting comfortably in chair, NAD, calss I obesity HEENT: NCAT. PERRLA. Sclera without injection or icterus. MMM. Clear oropharynx. Neck: Supple. No LAD Cardiac: Regular rate and rhythm. Normal S1/S2. No murmurs, rubs, or gallops appreciated. 2+ radial pulses. Lungs: Clear bilaterally to ascultation. No increased WOB, no accessory muscle usage. No w/r/r. Neuro: AOx1 Ext: no edema Psych: Pleasant and appropriate   ASSESSMENT/PLAN:   Pre-diabetes A1c 6.1%. Discussed exercise and diet changes. Follow up in 6 months-1 year  Dementia [facility]) Present, patient is currently stable, not having problems of wandering. She was previously having sundowning, but family also has seroquel 50 mg to use at night and report improvement. Discussed nature  of disease and gave family packet for advanced care planning.  Hyperlipidemia Patient on a statin. Will check lipid panel and CMP.  Annual Examination  See AVS for age appropriate recommendations  PHQ score 1, reviewed and discussed.  BP reviewed and at goal.  Asked about intimate partner violence and resources given as appropriate  Advance directives discussion- given packet  Considered the following items based upon USPSTF recommendations: Diabetes screening: discussed and ordered- no longer prediabetes 6.1% A1c today Screening for elevated cholesterol: discussed and ordered HIV testing: discussed and not ordered- low risk Hepatitis C: discussed and not ordered- low risk Hepatitis B: discussed and not ordered- low risk Syphilis if at high risk: discussed and not ordered- low risk GC/CT not at high risk and not ordered. Osteoporosis screening considered based upon risk of fracture from [facility] calculator. Major osteoporotic fracture risk is 10%. DEXA ordered.  Reviewed risk factors for latent tuberculosis and not indicated  Cervical cancer screening:  out of screening age, reports  no past h/o abnormal paps Breast cancer screening:  outside of screening range, reports  no h/o breast cancer, no breast concerns Colorectal cancer screening:  I do not recommend screening colonoscopy at age 75 in setting of no GI bleed, no family h/o CRC, and dementia Lung cancer screening: out of screening age. Vaccinations UTD with covid and flu, will get pcv 20 today.   Follow up in 1 year or sooner if indicated.    Shirlean Mylar, MD [facility]

## 2021-02-03 ENCOUNTER — Encounter: Payer: Self-pay | Admitting: Family Medicine

## 2021-02-03 ENCOUNTER — Other Ambulatory Visit: Payer: Self-pay

## 2021-02-03 ENCOUNTER — Ambulatory Visit (INDEPENDENT_AMBULATORY_CARE_PROVIDER_SITE_OTHER): Payer: Medicare Other | Admitting: Family Medicine

## 2021-02-03 VITALS — BP 126/64 | HR 84 | Wt 162.0 lb

## 2021-02-03 DIAGNOSIS — R7303 Prediabetes: Secondary | ICD-10-CM

## 2021-02-03 DIAGNOSIS — E782 Mixed hyperlipidemia: Secondary | ICD-10-CM | POA: Diagnosis not present

## 2021-02-03 DIAGNOSIS — Z13820 Encounter for screening for osteoporosis: Secondary | ICD-10-CM | POA: Diagnosis not present

## 2021-02-03 DIAGNOSIS — F02C3 Dementia in other diseases classified elsewhere, severe, with mood disturbance: Secondary | ICD-10-CM | POA: Diagnosis not present

## 2021-02-03 DIAGNOSIS — Z23 Encounter for immunization: Secondary | ICD-10-CM | POA: Diagnosis not present

## 2021-02-03 DIAGNOSIS — Z780 Asymptomatic menopausal state: Secondary | ICD-10-CM

## 2021-02-03 DIAGNOSIS — G309 Alzheimer's disease, unspecified: Secondary | ICD-10-CM

## 2021-02-03 DIAGNOSIS — E785 Hyperlipidemia, unspecified: Secondary | ICD-10-CM | POA: Insufficient documentation

## 2021-02-03 LAB — POCT GLYCOSYLATED HEMOGLOBIN (HGB A1C): HbA1c, POC (prediabetic range): 6.1 % (ref 5.7–6.4)

## 2021-02-03 NOTE — Assessment & Plan Note (Signed)
Present, patient is currently stable, not having problems of wandering. She was previously having sundowning, but family also has seroquel 50 mg to use at night and report improvement. Discussed nature of disease and gave family packet for advanced care planning.

## 2021-02-03 NOTE — Patient Instructions (Addendum)
It was a pleasure to see you today!  We will get some labs today.  If they are abnormal or we need to do something about them, I will call you.  If they are normal, I will send you a message on MyChart (if it is active) or a letter in the mail.  If you don't hear from [place] in 2 weeks, please call the office  [phone]. Please call the [facility] to schedule a Dexa scan (bone mineral density) Both patients got the pneumonia vaccine today Follow up in 1 year for physical or sooner for new problems For headache: try using overhead light (not phone screen), warm compress, tylenol or ibuprofen   Be Well,  Dr. Ruthe Mannan un placer verte hoy!  1. Obtendremos algunos laboratorios hoy. Si son anormales o necesitamos hacer algo al respecto, lo llamar. Si son normales, Transport planner un mensaje en MyChart (si est Musician) o una carta por correo. Si no tiene noticias nuestras en 2 semanas, llame a la oficina [phone]. 2. Llame al [facility] para programar una exploracin Dexa (densidad mineral sea) 3. Ambos pacientes recibieron la vacuna contra la neumona hoy. 4. Seguimiento en 1 ao para problemas fsicos o antes para problemas nuevos 5. Para el dolor de cabeza: intente usar una luz de techo (no la pantalla del telfono), compresas tibias, tylenol o ibuprofeno   [place],  [employer]

## 2021-02-03 NOTE — Assessment & Plan Note (Signed)
A1c 6.1%. Discussed exercise and diet changes. Follow up in 6 months-1 year

## 2021-02-03 NOTE — Assessment & Plan Note (Signed)
Patient on a statin. Will check lipid panel and CMP.

## 2021-02-04 ENCOUNTER — Other Ambulatory Visit: Payer: Self-pay | Admitting: Family Medicine

## 2021-02-04 LAB — COMPREHENSIVE METABOLIC PANEL
ALT: 15 IU/L (ref 0–32)
AST: 20 IU/L (ref 0–40)
Albumin/Globulin Ratio: 1.4 (ref 1.2–2.2)
Albumin: 3.9 g/dL (ref 3.7–4.7)
Alkaline Phosphatase: 93 IU/L (ref 44–121)
BUN/Creatinine Ratio: 39 — ABNORMAL HIGH (ref 12–28)
BUN: 24 mg/dL (ref 8–27)
Bilirubin Total: 0.3 mg/dL (ref 0.0–1.2)
CO2: 25 mmol/L (ref 20–29)
Calcium: 9.2 mg/dL (ref 8.7–10.3)
Chloride: 102 mmol/L (ref 96–106)
Creatinine, Ser: 0.61 mg/dL (ref 0.57–1.00)
Globulin, Total: 2.7 g/dL (ref 1.5–4.5)
Glucose: 97 mg/dL (ref 70–99)
Potassium: 4.6 mmol/L (ref 3.5–5.2)
Sodium: 140 mmol/L (ref 134–144)
Total Protein: 6.6 g/dL (ref 6.0–8.5)
eGFR: 90 mL/min/{1.73_m2} (ref >59)

## 2021-02-04 LAB — LIPID PANEL
Chol/HDL Ratio: 2.4 ratio (ref 0.0–4.4)
Cholesterol, Total: 123 mg/dL (ref 100–199)
HDL: 51 mg/dL (ref >39)
LDL Chol Calc (NIH): 43 mg/dL (ref 0–99)
Triglycerides: 174 mg/dL — ABNORMAL HIGH (ref 0–149)
VLDL Cholesterol Cal: 29 mg/dL (ref 5–40)

## 2021-02-08 ENCOUNTER — Other Ambulatory Visit: Payer: Self-pay | Admitting: Family Medicine

## 2021-02-11 ENCOUNTER — Encounter: Payer: Self-pay | Admitting: Family Medicine

## 2021-02-11 ENCOUNTER — Telehealth: Payer: Self-pay | Admitting: Family Medicine

## 2021-02-11 NOTE — Telephone Encounter (Signed)
Attempted to call SIL and daughter regarding lab work from recent visit. Patient has very mild hypertriglyceridemia. In setting of advanced dementia, I do not recommend any changes. HIPAA compliant VM left using Spanish interpreter. Will send letter reflecting this information.  Shirlean Mylar, MD [facility] Family Medicine Residency, PGY-3

## 2021-02-11 NOTE — Progress Notes (Signed)
See phone note

## 2021-04-21 ENCOUNTER — Other Ambulatory Visit: Payer: Self-pay | Admitting: Family Medicine

## 2021-06-04 ENCOUNTER — Other Ambulatory Visit: Payer: Self-pay

## 2021-06-05 MED ORDER — ATORVASTATIN CALCIUM 20 MG PO TABS: 20.0000 mg | ORAL_TABLET | Freq: Every day | ORAL | 0 refills | Status: DC

## 2021-06-08 ENCOUNTER — Other Ambulatory Visit: Payer: Self-pay | Admitting: Family Medicine

## 2021-06-16 ENCOUNTER — Encounter: Payer: Self-pay | Admitting: Family Medicine

## 2021-06-16 ENCOUNTER — Other Ambulatory Visit: Payer: Self-pay

## 2021-06-16 ENCOUNTER — Ambulatory Visit (INDEPENDENT_AMBULATORY_CARE_PROVIDER_SITE_OTHER): Payer: Medicare Other | Admitting: Family Medicine

## 2021-06-16 VITALS — BP 112/55 | HR 62 | Wt 164.4 lb

## 2021-06-16 DIAGNOSIS — R4586 Emotional lability: Secondary | ICD-10-CM

## 2021-06-16 DIAGNOSIS — F339 Major depressive disorder, recurrent, unspecified: Secondary | ICD-10-CM | POA: Insufficient documentation

## 2021-06-16 DIAGNOSIS — B351 Tinea unguium: Secondary | ICD-10-CM | POA: Insufficient documentation

## 2021-06-16 MED ORDER — TERBINAFINE HCL 250 MG PO TABS: 250.0000 mg | ORAL_TABLET | Freq: Every day | ORAL | 0 refills | Status: AC

## 2021-06-16 NOTE — Progress Notes (Signed)
    SUBJECTIVE:   CHIEF COMPLAINT / HPI:   Patient presents with son for infected toe nails. Accompanied by son who is providing history and translating.    Son stated he noticed toenail thickening and discoloration about a month ago but his father states it has been there for a while. Patient has not complained about it bothering her but son states he had it before and took the oral medication and it really helped him. Hoping for the same for his mom. Has not tried anything for the toe nails. He does note that patient has dementia but told him it doesn't hurt her. Has been ambulating without much difficulty.  PERTINENT  PMH / PSH:  Dementia, prediabetes, hyperlipidemia OBJECTIVE:   BP (!) 112/55   Pulse 62   Wt 164 lb 6.4 oz (74.6 kg)   SpO2 98%   BMI 32.11 kg/m    Physical exam General: well appearing, NAD Cardiovascular: RRR, no murmurs Lungs: CTAB. Normal WOB Abdomen: soft, non-distended, non-tender Skin: warm, dry. No edema Extremities: Multiple toenails thickened and discolored. Non tender to palpation.        ASSESSMENT/PLAN:   Onychomycosis Patient presents with multiple toenails bilaterally that are thickened and discolored.  Son noticed it a month ago, though he states it has been going on for quite a while.  Patient does not seem to be bothered by it, but utilizing shared decision making family they would like to try treatment.  Given multiple toes affected will treat systemically with oral terbinafine to 250 mg daily for 12 weeks. Her liver enzymes were checked in January, so will not check today.  Depression, recurrent (HCC) PHQ-9 score of 21 and with positive #9. filled out by son.  Discussed in detail.  Patient is not currently suicidal, and the son feels that her dementia contributes to her waxing and waning.  He states at times she gets very irritable, and has expressed her not wanting to live anymore.  Does not have a plan to carry this out.  And currently  son states she feels well and without these thoughts.  We will continue current medication regimen of Paxil and Seroquel.     Cora Collum, DO [facility]

## 2021-06-16 NOTE — Assessment & Plan Note (Signed)
PHQ-9 score of 21 and with positive #9. filled out by son.  Discussed in detail.  Patient is not currently suicidal, and the son feels that her dementia contributes to her waxing and waning.  He states at times she gets very irritable, and has expressed her not wanting to live anymore.  Does not have a plan to carry this out.  And currently son states she feels well and without these thoughts.  We will continue current medication regimen of Paxil and Seroquel.

## 2021-06-16 NOTE — Patient Instructions (Signed)
It was great seeing you today!  You were seen for toenail fungus, and I have prescribed terbinafine to use every day for 3 months.  If there are any questions or concerns that come up, please just give [place] a call!  Visit Reminders: - Stop by the pharmacy to pick up your prescriptions   Feel free to call with any questions or concerns at any time, at [phone].   Take care,  Dr. Cora Collum [facility]

## 2021-06-16 NOTE — Assessment & Plan Note (Signed)
Patient presents with multiple toenails bilaterally that are thickened and discolored.  Son noticed it a month ago, though he states it has been going on for quite a while.  Patient does not seem to be bothered by it, but utilizing shared decision making family they would like to try treatment.  Given multiple toes affected will treat systemically with oral terbinafine to 250 mg daily for 12 weeks. Her liver enzymes were checked in January, so will not check today.

## 2021-06-30 ENCOUNTER — Ambulatory Visit
Admission: RE | Admit: 2021-06-30 | Discharge: 2021-06-30 | Disposition: A | Discharge status: Home / Self Care | Payer: Medicare Other | Source: Ambulatory Visit | Attending: Family Medicine | Admitting: Family Medicine

## 2021-06-30 DIAGNOSIS — Z13820 Encounter for screening for osteoporosis: Secondary | ICD-10-CM

## 2021-07-19 ENCOUNTER — Telehealth: Payer: Self-pay | Admitting: Family Medicine

## 2021-07-19 NOTE — Telephone Encounter (Signed)
Called patient's phone, spoke with her daughter and son-in-law per patient request. Her SIL speaks English and accompanies her to appointments as she has alzheimer's disease. DEXA scan shows osteoporosis, recommend they follow up with new PCP. Made appt to discuss treatment options with Dr. Idalia Needle on 7/26 at 10:30 AM.   Spanish intreperter used.  Shirlean Mylar, MD [facility], PGY-3

## 2021-08-04 ENCOUNTER — Ambulatory Visit (INDEPENDENT_AMBULATORY_CARE_PROVIDER_SITE_OTHER): Payer: Medicare Other | Admitting: Family Medicine

## 2021-08-04 ENCOUNTER — Encounter: Payer: Self-pay | Admitting: Family Medicine

## 2021-08-04 VITALS — BP 110/72 | HR 74 | Ht 60.0 in | Wt 165.0 lb

## 2021-08-04 DIAGNOSIS — M810 Age-related osteoporosis without current pathological fracture: Secondary | ICD-10-CM | POA: Diagnosis present

## 2021-08-04 MED ORDER — ALENDRONATE SODIUM 70 MG PO TABS: 70.0000 mg | ORAL_TABLET | ORAL | 0 refills | Status: DC

## 2021-08-04 MED ORDER — OYSTER SHELL CALCIUM/D3 500-5 MG-MCG PO TABS: 1.0000 | ORAL_TABLET | Freq: Every day | ORAL | 0 refills | Status: AC

## 2021-08-04 NOTE — Patient Instructions (Addendum)
It was great seeing you today!  You were seen for osteoporosis, we are starting you on bisphosphonates.  You will take this once a week on the same day, and make sure you drink a full glass of water on an empty stomach and sit upright for about 30 minutes after taking this medicine.   I have also prescribed a vitamin D and calcium supplement to take daily.  We will not need to repeat a bone density scan for another 5 years.  Visit Reminders: - Stop by the pharmacy to pick up your prescriptions  - Continue to work on your healthy eating habits and incorporating exercise into your daily life.   Feel free to call with any questions or concerns at any time, at [phone].   Take care,  Dr. Cora Collum [facility]  Alendronate; Cholecalciferol Oral Tablets Qu es este medicamento? El ALENDRONATO; COLECALCIFEROL desacelera la prdida de calcio de los [place]. Se [place] para tratar la osteoporosis. Se puede usar en otras personas con riesgo de prdida de masa sea. Este medicamento puede ser utilizado para otros usos; si tiene alguna pregunta consulte con su proveedor de atencin mdica o con su farmacutico. MARCAS COMUNES: Fosamax Plus D Qu le debo informar a mi profesional de la salud antes de tomar este medicamento? Necesitan saber si usted presenta alguno de los siguientes problemas o situaciones: trastorno de sangrado cncer enfermedad dental dificultad para tragar niveles altos de vitamina D en la sangre infeccin (fiebre, escalofros, tos, dolor de garganta, dolor o dificultad para Geographical information systems officer) enfermedad renal niveles bajos de calcio u otros minerales en la sangre cantidad baja de glbulos rojos recibe medicamentos esteroideos tales como dexametasona o prednisona problemas estomacales o intestinales problemas para sentarse o pararse durante 30 minutos una reaccin alrgica o inusual al alendronato, al colecalciferol, a otros frmacos, alimentos,  colorantes o conservantes si est embarazada o buscando quedar embarazada si est amamantando a un beb Cmo debo utilizar este medicamento? Tome este frmaco por va oral con un vaso lleno de agua. selo segn las instrucciones en la etiqueta el mismo da todas las semanas. Tome la dosis de inmediato despus de despertarse. No coma ni beba nada antes de tomarla. No la tome con ninguna otra bebida excepto agua. No mastique ni triture la tableta. Despus de tomar este medicamento, no desayune, no beba nada ni tome ningn otro frmaco o vitamina durante al menos 30 minutos. Qudese sentado o parado durante al menos 30 minutos despus de tomar [employer]. No se acueste. Siga usndolo a menos que su proveedor de Insurance risk surveyor indique dejar de [place]. Su farmacutico le dar una Gua del medicamento especial (MedGuide, nombre en ingls) con cada receta y en cada ocasin que la vuelva a surtir. Asegrese de leer esta informacin cada vez cuidadosamente. Hable con su proveedor de atencin [employer] uso de este frmaco en nios. Puede requerir atencin especial. Sobredosis: Pngase en contacto inmediatamente con un centro toxicolgico o una sala de urgencia si usted cree que haya tomado demasiado medicamento. ATENCIN: [employer] es solo para usted. No comparta este medicamento con nadie. Qu sucede si me olvido de una dosis? Si toma el frmaco una vez por da, [place] esa dosis. Tome su siguiente dosis en el horario programado la maana siguiente. No tome dos dosis el [employer]. Si toma el frmaco una vez por semana, tome la dosis que omiti la maana siguiente despus de acordarse. No tome dos dosis el [employer].  Qu puede interactuar con este medicamento? anticidos aspirina y frmacos tipo aspirina suplementos de calcio, especialmente calcio con vitamina D colestiramina o colestipol cimetidina, ranitidina u otros medicamentos usados para disminuir el cido  estomacal corticosteroides suplementos de hierro suplementos de magnesio aceite mineral AINE, medicamentos para el dolor y la inflamacin, tales como ibuprofeno o naproxeno orlistat fenobarbital fenitona suplementos de fsforo primidona medicamentos esteroideos, tales como la prednisona o la cortisona diurticos tiazdicos vitaminas con minerales, especialmente la vitamina D Puede ser que esta lista no menciona todas las posibles interacciones. Informe a su profesional de [employer] de [employer] productos a base de hierbas, medicamentos de [place] o suplementos nutritivos que est tomando. Si usted fuma, consume bebidas alcohlicas o si utiliza drogas ilegales, indqueselo tambin a su profesional de [employer]. Algunas sustancias pueden interactuar con su medicamento. A qu debo estar atento al usar [employer]? Visite a su proveedor de atencin mdica para que revise regularmente su evolucin. Es posible que pase un tiempo antes de que pueda notar los beneficios de este frmaco. Environmental manager personas que toman este frmaco tienen dolor intenso en los [place], las articulaciones o los msculos. Este frmaco tambin puede aumentar su riesgo de problemas en la [place] o de tener una fractura de fmur. Informe a su proveedor de Psychologist, prison and probation services de inmediato si tiene Psychologist, prison and probation services en la mandbula, los huesos, las articulaciones o los msculos. Informe a su proveedor de atencin mdica si tiene dolor que no desaparece o que empeora. Informe a su dentista y a su Airline pilot que est usando este frmaco. No deben realizarle una ciruga dental mayor mientras [place] este frmaco. Antes de comenzar a usar este frmaco, visite a su dentista para que Freight forwarder un examen y arregle cualquier problema dental. Cuide bien sus dientes mientras [place] este frmaco. Asegrese de visitar a su dentista para citas de seguimiento peridicas. Debe asegurarse de recibir suficiente calcio y vitamina D mientras toma este  frmaco. Converse con su proveedor de atencin mdica sobre los alimentos que come y las vitaminas que toma. Usted podra necesitar realizarse [employer] de sangre mientras est usando este frmaco. Qu efectos secundarios puedo tener al utilizar este medicamento? Efectos secundarios que debe informar a su mdico o a su proveedor de atencin mdica tan pronto como sea posible: Therapist, art (erupcin cutnea, comezn/picazn o urticaria; hinchazn de la cara, los labios o la lengua) dolor de huesos acidez (sensacin de ardor en el pecho, generalmente despus de comer o al estar acostado) dolor de la mandbula, especialmente despus de tratamiento odontolgico dolor en las articulaciones niveles bajos de calcio (frecuencia cardiaca rpida; calambres musculares o dolor muscular; dolor, hormigueo o entumecimiento en las manos o pies; convulsiones) dolor muscular dolor o dificultad al tragar enrojecimiento, formacin de ampollas, descamacin o distensin de la piel, incluso dentro de la boca sangrado estomacal (heces con sangre o de color negro y aspecto alquitranado; escupir sangre o Biomedical engineer que tiene el aspecto de posos [residuos] de caf) Efectos secundarios que generalmente no requieren atencin mdica (debe informarlos a su mdico o a Administrator de atencin mdica si persisten o si son molestos): estreimiento diarrea nuseas dolor estomacal Puede ser que esta lista no menciona todos los posibles efectos secundarios. Comunquese a su mdico por asesoramiento mdico [employer]. Usted puede informar los efectos secundarios a la FDA por telfono al 1-800-FDA-1088. Dnde debo guardar mi medicina? Mantenga fuera del alcance de nios y Neurosurgeon. Guarde a [employer], entre 15 y 85  grados Celsius (59 y 37 grados Fahrenheit). Proteja de la luz y la humedad. Mantenga este frmaco en el envase original hasta que est listo para usarlo. Deseche todo el frmaco  que no haya utilizado despus de la fecha de vencimiento. ATENCIN: Este folleto es un resumen. Puede ser que no cubra toda la posible informacin. Si usted tiene preguntas acerca de esta medicina, consulte con su mdico, su farmacutico o su profesional de Radiographer, therapeutic.  2023 Elsevier/Gold Standard (2019-05-02 00:00:00)

## 2021-08-04 NOTE — Assessment & Plan Note (Signed)
Dexa scan showed right femur T-score of -2.7, total femur mean -2.6, AP spine -2.1. Will start Alendronate 70mg  weekly. Instructed family on how to take medication. Also prescribed Calcium-Vitamin D daily. Will check Vit D level and BMP. Repeat bone density scan in 5 years.

## 2021-08-04 NOTE — Progress Notes (Signed)
    SUBJECTIVE:   CHIEF COMPLAINT / HPI:   Patient presents with her son (who is also translating) for follow up on recent Dexa scan results which indicated osteoporosis. Family denies recent falls, last fall about a year and a half ago. No history of fractures. Not taking vitamin D or calcium. Interested in treatment.   PERTINENT  PMH / PSH: Reviewed   OBJECTIVE:   BP 110/72   Pulse 74   Ht 5' (1.524 m)   Wt 165 lb (74.8 kg)   SpO2 98%   BMI 32.22 kg/m    General: pleasantly demented, NAD CV: RRR no murmurs  Resp: CTAB normal WOB GI: soft, non distended  Derm: warm, dry. No LE edema   ASSESSMENT/PLAN:   No problem-specific Assessment & Plan notes found for this encounter.   Osteoporosis Dexa scan showed right femur T-score of -2.7, total femur mean -2.6, AP spine -2.1. Will start Alendronate 70mg  weekly. Instructed family on how to take medication. Also prescribed Calcium-Vitamin D daily. Will check Vit D level and BMP. Repeat bone density scan in 5 years.  , DO [facility]

## 2021-08-05 ENCOUNTER — Encounter: Payer: Self-pay | Admitting: Family Medicine

## 2021-08-05 LAB — BASIC METABOLIC PANEL
BUN/Creatinine Ratio: 29 — ABNORMAL HIGH (ref 12–28)
BUN: 19 mg/dL (ref 8–27)
CO2: 26 mmol/L (ref 20–29)
Calcium: 8.8 mg/dL (ref 8.7–10.3)
Chloride: 102 mmol/L (ref 96–106)
Creatinine, Ser: 0.66 mg/dL (ref 0.57–1.00)
Glucose: 127 mg/dL — ABNORMAL HIGH (ref 70–99)
Potassium: 4.2 mmol/L (ref 3.5–5.2)
Sodium: 141 mmol/L (ref 134–144)
eGFR: 88 mL/min/{1.73_m2} (ref >59)

## 2021-08-05 LAB — VITAMIN D 25 HYDROXY (VIT D DEFICIENCY, FRACTURES): Vit D, 25-Hydroxy: 28.2 ng/mL — ABNORMAL LOW (ref 30.0–100.0)

## 2021-09-20 ENCOUNTER — Other Ambulatory Visit: Payer: Self-pay | Admitting: Family Medicine

## 2021-11-05 ENCOUNTER — Other Ambulatory Visit: Payer: Self-pay | Admitting: Family Medicine
